# Patient Record
Sex: Male | Born: 1970 | Hispanic: Yes | Marital: Married | Smoking: Current every day smoker
Health system: Southern US, Community
[De-identification: ages and names within clinical notes are randomized; demographics above are authoritative.]

## PROBLEM LIST (undated history)

## (undated) HISTORY — PX: HERNIA REPAIR: SHX51

## (undated) HISTORY — PX: NOSE SURGERY: SHX723

---

## 2008-08-22 HISTORY — PX: APPENDECTOMY: SHX54

## 2012-01-03 ENCOUNTER — Emergency Department (HOSPITAL_BASED_OUTPATIENT_CLINIC_OR_DEPARTMENT_OTHER)
Admission: EM | Admit: 2012-01-03 | Discharge: 2012-01-03 | Disposition: A | Discharge status: Home / Self Care | Payer: Self-pay | Attending: Emergency Medicine | Admitting: Emergency Medicine

## 2012-01-03 ENCOUNTER — Encounter (HOSPITAL_BASED_OUTPATIENT_CLINIC_OR_DEPARTMENT_OTHER): Payer: Self-pay | Admitting: *Deleted

## 2012-01-03 DIAGNOSIS — S81819A Laceration without foreign body, unspecified lower leg, initial encounter: Secondary | ICD-10-CM

## 2012-01-03 DIAGNOSIS — W269XXA Contact with unspecified sharp object(s), initial encounter: Secondary | ICD-10-CM | POA: Insufficient documentation

## 2012-01-03 DIAGNOSIS — S81009A Unspecified open wound, unspecified knee, initial encounter: Secondary | ICD-10-CM | POA: Insufficient documentation

## 2012-01-03 DIAGNOSIS — S91009A Unspecified open wound, unspecified ankle, initial encounter: Secondary | ICD-10-CM | POA: Insufficient documentation

## 2012-01-03 MED ORDER — LIDOCAINE HCL 2 % IJ SOLN
20.0000 mL | Freq: Once | INTRAMUSCULAR | Status: AC
  Administered 2012-01-03: 20 mg via INTRADERMAL
  Filled 2012-01-03: qty 1

## 2012-01-03 NOTE — ED Notes (Signed)
Pt has laceration to left calf after being cut by piece of metal, tetanus UTD

## 2012-01-03 NOTE — ED Provider Notes (Signed)
Medical screening examination/treatment/procedure(s) were performed by non-physician practitioner and as supervising physician I was immediately available for consultation/collaboration.   Jomes Giraldo, MD 01/03/12 2209 

## 2012-01-03 NOTE — Discharge Instructions (Signed)
Cuidados de una laceracin - Adultos  (Laceration Care, Adult)  Una laceracin es un corte que atraviesa todas las capas de la piel. El corte llega hasta las capas inferiores de la piel.  CUIDADOS EN EL HOGAR  Si tiene puntos (suturas) o grapas:   Mantenga el corte limpio y seco.   Si tiene un (vendaje) cmbielo al menos una vez al da. Cmbielo si se moja o se ensucia, o segn las indicaciones del mdico.   Lave el corte dos veces por da con agua y jabn. Enjugelo con agua. Seque dando palmaditas con un pao limpio y seco.   Aplique una capa delgada de crema con medicamento sobre el corte, segn las indicaciones del mdico.   Puede ducharse despus de las primeras 24 horas. No remoje la herida en agua hasta que le hayan quitado los puntos.   Tome slo los medicamentos que le haya indicado el mdico.   Concurra para que le retiren los puntos cuando el mdico le indique.  En caso que tenga tiras adhesivas:   Mantenga la herida limpia y seca.   No deje que las tiras se mojen. Puede tomar un bao, pero tenga cuidado de no mojar el corte.   Si se moja, squelo dando palmaditas con una toalla limpia.   Las tiras caern por s mismas. No quite las tiras que estn pegadas a la herida.  En caso que le hayan aplicado adhesivo.   Puede ducharse o tomar un bao de inmersin. No frote ni sumerja la herida. Nopractique natacin. Evite transpirar mucho hasta que el adhesivo desaparezca. Despus de ducharse o darse un bao, seque el corte dando palmaditas con una toalla limpia.   No aplique medicamentos ni maquillaje hasta que el adhesivo caiga.   Si tiene un vendaje, No peque cinta adhesiva sobre el adhesivo   Evite la luz solar o las lmparas para bronceado hasta que el adhesivo desaparezca. Aplique pantalla solar sobre el corte durante el primer ao, para reducir la cicatriz.   El adhesivo caer por s mismo. No quite el pegamento.  Deber aplicarse la vacuna contra el ttanos si:  No  recuerda cundo se coloc la vacuna la ltima vez.   Nunca recibi esta vacuna.  Si usted necesita aplicarse la vacuna y se niega a recibirla, corre riesgo de contraer ttanos. sta es una enfermedad grave.  SOLICITE AYUDA DE INMEDIATO SI:   El dolor no mejora con los medicamentos prescriptos.   Pierde la sensibilidad (adormecimiento) en el brazo, la mano, la pierna o el pie u observa cambios en el color.   El corte sangra.   Siente debilidad en la articulacin o no puede usarla.   Tiene bultos que le duelen en el cuerpo.   La zona del corte est roja, le duele o esthinchada.   Hay una lnea roja en la piel, cerca del corte.   Observa un lquido blanco amarillento (pus) en la herida.   Tiene fiebre.   Advierte un olor ftido que proviene de la herida o del vendaje.   La herida se abre antes o despus de que le hayan quitado los puntos.   Nota que en la herida hay algn cuerpo extrao como un trozo de madera o vidrio.   No puede mover los dedos.  ASEGRESE DE QUE:   Comprende estas instrucciones.   Controlar su enfermedad.   Solicitar ayuda de inmediato si no mejora o si empeora.  Document Released: 04/06/2011 Document Revised: 07/28/2011 ExitCare Patient Information 2012 [employer]. 

## 2012-01-03 NOTE — ED Provider Notes (Signed)
History     CSN: [id number]  Arrival date & time 01/03/12  2013   First MD Initiated Contact with Patient 01/03/12 2015      Chief Complaint  Patient presents with  . Extremity Laceration    (Consider location/radiation/quality/duration/timing/severity/associated sxs/prior treatment) Patient is a 41 y.o. male presenting with skin laceration. The history is provided by the patient. No language interpreter was used.  Laceration  The incident occurred less than 1 hour ago. Pain location: left lower leg. The laceration is 7 cm in size. The laceration mechanism was a a metal edge. The patient is experiencing no pain. The pain has been constant since onset. He reports no foreign bodies present.    History reviewed. No pertinent past medical history.  History reviewed. No pertinent past surgical history.  History reviewed. No pertinent family history.  History  Substance Use Topics  . Smoking status: Former Games developer  . Smokeless tobacco: Not on file  . Alcohol Use: No      Review of Systems  Constitutional: Negative.   Respiratory: Negative.   Cardiovascular: Negative.   Skin: Positive for wound.    Allergies  Review of patient's allergies indicates no known allergies.  Home Medications  No current outpatient prescriptions on file.  BP 150/108  Pulse 65  Temp(Src) 98.5 F (36.9 C) (Oral)  Resp 18  SpO2 100%  Physical Exam  Nursing note and vitals reviewed. Constitutional: He is oriented to person, place, and time. He appears well-developed and well-nourished.  HENT:  Head: Normocephalic and atraumatic.  Eyes: EOM are normal.  Cardiovascular: Normal rate and regular rhythm.   Pulmonary/Chest: Effort normal and breath sounds normal.  Musculoskeletal: Normal range of motion.  Neurological: He is alert and oriented to person, place, and time.  Skin:       Pt has a laceration to the left lateral lower leg    ED Course  LACERATION REPAIR Performed by:  Teressa Lower Authorized by: Teressa Lower Consent: Verbal consent obtained. Written consent not obtained. Risks and benefits: risks, benefits and alternatives were discussed Consent given by: patient Patient understanding: patient states understanding of the procedure being performed Patient identity confirmed: verbally with patient Time out: Immediately prior to procedure a "time out" was called to verify the correct patient, procedure, equipment, support staff and site/side marked as required. Body area: lower extremity Location details: left lower leg Laceration length: 7 cm Foreign bodies: no foreign bodies Anesthesia: local infiltration Local anesthetic: lidocaine 2% without epinephrine Anesthetic total: 5 ml Irrigation solution: saline Irrigation method: syringe Amount of cleaning: standard Skin closure: staples Approximation: close Approximation difficulty: simple Patient tolerance: Patient tolerated the procedure well with no immediate complications.   (including critical care time)  Labs Reviewed - No data to display No results found.   1. Leg laceration       MDM  Pt tetanus is NWG:NFAOZ closed without any problem:no concern for fb based on injury        Teressa Lower, NP 01/03/12 2104

## 2013-05-24 ENCOUNTER — Emergency Department
Admission: EM | Admit: 2013-05-24 | Discharge: 2013-05-24 | Disposition: A | Discharge status: Home / Self Care | Payer: BC Managed Care – PPO | Source: Home / Self Care | Attending: Family Medicine | Admitting: Family Medicine

## 2013-05-24 ENCOUNTER — Emergency Department (INDEPENDENT_AMBULATORY_CARE_PROVIDER_SITE_OTHER): Payer: BC Managed Care – PPO

## 2013-05-24 ENCOUNTER — Encounter: Payer: Self-pay | Admitting: Emergency Medicine

## 2013-05-24 DIAGNOSIS — J342 Deviated nasal septum: Secondary | ICD-10-CM

## 2013-05-24 DIAGNOSIS — J341 Cyst and mucocele of nose and nasal sinus: Secondary | ICD-10-CM

## 2013-05-24 DIAGNOSIS — J3489 Other specified disorders of nose and nasal sinuses: Secondary | ICD-10-CM

## 2013-05-24 DIAGNOSIS — R030 Elevated blood-pressure reading, without diagnosis of hypertension: Secondary | ICD-10-CM

## 2013-05-24 MED ORDER — PREDNISONE 20 MG PO TABS: 20.0000 mg | ORAL_TABLET | Freq: Two times a day (BID) | ORAL | Status: DC

## 2013-05-24 NOTE — ED Notes (Signed)
Sinus problem, congestion, watery eyes, snoring, took 10 days of amoxicillin

## 2013-05-24 NOTE — ED Provider Notes (Signed)
CSN: [id number]     Arrival date & time 05/24/13  9562 History   First MD Initiated Contact with Patient 05/24/13 [phone]     Chief Complaint  Patient presents with  . Sinus Problem      HPI Comments: Patient complains of approximately 3 week history of increased sinus congestion resulting in mouth breathing at night.  He now snores at night, and reports increased daytime fatigue.  His was prescribed amoxicillin for sinusitis three weeks ago, as well as a steroid nasal spray.  He has had no improvement.  He tried Careers adviser without much improvement also.  He feels well otherwise.  No fevers, chills, and sweats.  No recent URI symptoms. No family history of hypertension.  The history is provided by the patient.    History reviewed. No pertinent past medical history. History reviewed. No pertinent past surgical history. No pertinent family history. History  Substance Use Topics  . Smoking status: Former Games developer  . Smokeless tobacco: Not on file  . Alcohol Use: No    Review of Systems  Constitutional: Positive for fatigue. Negative for fever and chills.  HENT: Positive for congestion, rhinorrhea and postnasal drip. Negative for ear pain, nosebleeds, sore throat, facial swelling, trouble swallowing, neck pain, neck stiffness, voice change and sinus pressure.   Eyes: Positive for discharge. Negative for photophobia, pain, redness and itching.  Respiratory:       Snoring  Cardiovascular: Negative.   Gastrointestinal: Negative.   Genitourinary: Negative.   Musculoskeletal: Negative.   Skin: Negative.   Neurological: Positive for headaches.  All other systems reviewed and are negative.    Allergies  Review of patient's allergies indicates not on file.  Home Medications   Current Outpatient Rx  Name  Route  Sig  Dispense  Refill  . ibuprofen (ADVIL,MOTRIN) 200 MG tablet   Oral   Take 400 mg by mouth every 6 (six) hours as needed. For pain         . predniSONE (DELTASONE) 20 MG  tablet   Oral   Take 1 tablet (20 mg total) by mouth 2 (two) times daily.   10 tablet   0    BP 153/101  Pulse 55  Temp(Src) 97.7 F (36.5 C) (Oral)  Ht 6' (1.829 m)  Wt 234 lb (106.142 kg)  BMI 31.73 kg/m2  SpO2 99% Physical Exam Nursing notes and Vital Signs reviewed. Appearance:  Patient appears stated age, and in no acute distress.  Patient is obese (BMI 31.7) Eyes:  Pupils are equal, round, and reactive to light and accomodation.  Extraocular movement is intact.  Conjunctivae are not inflamed  Ears:  Canals normal.  Tympanic membranes normal.  Nose:   Bilaterally congested and swollen turbinates.  No sinus tenderness.    Pharynx:  Rather restrictive oropharynx:  Unable to visualize posterior pharynx and tonsillar pillars.  Uvula barely visible. Neck:  Supple.   No adenopathy Lungs:  Clear to auscultation.  Breath sounds are equal.  Heart:  Regular rate and rhythm without murmurs, rubs, or gallops.  Skin:  No rash present.   ED Course  Procedures  none    Imaging Review Dg Sinuses Complete  05/24/2013   CLINICAL DATA:  Sinus congestion  EXAM: PARANASAL SINUSES - COMPLETE 3 + VIEW  COMPARISON:  None.  FINDINGS: Frontal, water's, and lateral views were obtained. There are suspected retention cysts in both inferior maxillary antra. Paranasal sinuses otherwise appear clear. There is no air-fluid level. No bony destruction  or expansion. There is leftward deviation of the inferior nasal septum.  IMPRESSION: Probable retention cysts in both inferior maxillary antra. Paranasal sinuses elsewhere clear. Mild deviation of nasal septum.   Electronically Signed   By: Bretta Bang M.D.   On: 05/24/2013 10:34    MDM   1. Mucous retention cyst of maxillary sinus, ?allergic rhinitis   2. Blood pressure elevated without history of HTN; consider sleep apnea    Begin prednisone burst. Continue prescription nose spray.  Begin Allegra, Claritin, or Zyrtec daily. Followup with ENT if not  improving. Followup with PCP for BP, and possible evaluation for sleep apnea     Lattie Haw, MD 05/24/13 [phone]

## 2013-08-07 ENCOUNTER — Ambulatory Visit (HOSPITAL_BASED_OUTPATIENT_CLINIC_OR_DEPARTMENT_OTHER): Payer: BC Managed Care – PPO

## 2014-06-05 ENCOUNTER — Emergency Department
Admission: EM | Admit: 2014-06-05 | Discharge: 2014-06-05 | Disposition: A | Discharge status: Home / Self Care | Payer: BC Managed Care – PPO | Source: Home / Self Care | Attending: Emergency Medicine | Admitting: Emergency Medicine

## 2014-06-05 ENCOUNTER — Encounter: Payer: Self-pay | Admitting: Emergency Medicine

## 2014-06-05 DIAGNOSIS — R1032 Left lower quadrant pain: Secondary | ICD-10-CM

## 2014-06-05 LAB — POCT URINALYSIS DIP (MANUAL ENTRY)
Bilirubin, UA: NEGATIVE
Glucose, UA: NEGATIVE
Ketones, POC UA: NEGATIVE
Leukocytes, UA: NEGATIVE
Nitrite, UA: NEGATIVE
Protein Ur, POC: NEGATIVE
Spec Grav, UA: 1.02 (ref 1.005–1.03)
Urobilinogen, UA: 0.2 (ref 0–1)
pH, UA: 5.5 (ref 5–8)

## 2014-06-05 LAB — POCT CBC W AUTO DIFF (K'VILLE URGENT CARE)

## 2014-06-05 NOTE — ED Provider Notes (Addendum)
CSN: [id number]     Arrival date & time 06/05/14  0831 History   First MD Initiated Contact with Patient 06/05/14 [phone]     Chief Complaint  Patient presents with  . Abdominal Pain   Reports four weeks of intermittent lower left abdominal pain, usually notices at night and feels better after going to bathroom.  HPI 43 year old male, no local PCP. Complains of 4 weeks of intermittent left lower quadrant abdominal pain. It can be dull or sharp without radiation. Sometimes severe up to 8/10, relieved after having a bowel movement. It was severe this morning, but relieved after normal BM this morning. Currently, abdominal pain level is 0. When he gets the pain, it's not associated with any other activity or movement. Denies change in bowel habits. No melena or bright red blood per rectum. No upper abdominal pain or symptoms or reflux symptoms. The abdominal pain is not affected by food. He eats a lot of spicy foods and that does not cause the left lower quadrant pain. Denies fever or chills or nausea or vomiting. I questioned him about any GU symptoms. Denies urinary frequency, dysuria, urethral discharge, or any chance of STD.  Urinary stream is normal . Denies hematuria. Denies any history of GU diagnoses . He admits to rare sharp penile pain about 2 times a day, lasts one second, and then resolves. Not associated with any activity or other symptoms.  Past surgical history of bilateral inguinal hernia repair January 2014 by Dr[facility]. He denies any inguinal or testicular pain. He denies flank pain or low back pain. History reviewed. No pertinent past medical history. Past Surgical History  Procedure Laterality Date  . Nose surgery    . Hernia repair Bilateral     inguinal   History reviewed. No pertinent family history. History  Substance Use Topics  . Smoking status: Former Games developermoker  . Smokeless tobacco: Not on file  . Alcohol Use: No    Review of Systems  All other  systems reviewed and are negative.   Allergies  Review of patient's allergies indicates no known allergies.  Home Medications   Prior to Admission medications   Not on File   BP 129/83  Pulse 56  Temp(Src) 97.7 F (36.5 C) (Oral)  Resp 16  SpO2 97% Physical Exam  Constitutional: He is oriented to person, place, and time. He appears well-developed and well-nourished. No distress.  Alert, pleasant male, no distress  HENT:  Head: Normocephalic and atraumatic.  Mouth/Throat: Oropharynx is clear and moist.  Eyes: Conjunctivae are normal. Pupils are equal, round, and reactive to light. No scleral icterus.  Neck: Normal range of motion. Neck supple. No JVD present.  Cardiovascular: Normal rate, regular rhythm and normal heart sounds.   Pulmonary/Chest: Effort normal and breath sounds normal. No respiratory distress. He has no wheezes. He has no rales. He exhibits no tenderness.  Abdominal: Soft. Bowel sounds are normal. He exhibits no distension, no abdominal bruit and no mass. There is no hepatosplenomegaly. There is tenderness in the left lower quadrant. There is no rigidity, no rebound, no guarding, no CVA tenderness, no tenderness at McBurney's point and negative Murphy's sign. Hernia confirmed negative in the right inguinal area and confirmed negative in the left inguinal area.    As depicted, equivocal LLQ abdominal wall hernia, reducible. Skin overlying this area is normal. No discoloration or ecchymosis.  Genitourinary: Testes normal and penis normal.  Musculoskeletal: Normal range of motion. He exhibits no edema  and no tenderness.  Lymphadenopathy:    He has no cervical adenopathy.  Neurological: He is alert and oriented to person, place, and time. No cranial nerve deficit.  Skin: Skin is warm and dry. No rash noted.  Psychiatric: He has a normal mood and affect. His behavior is normal.    ED Course  Procedures (including critical care time) Labs Review Labs Reviewed    URINE CULTURE  POCT URINALYSIS DIP (MANUAL ENTRY)  POCT CBC W AUTO DIFF ([facility])    Imaging Review No results found.   MDM   1. Abdominal pain, left lower quadrant    Urinalysis within normal limits except trace blood.--Likely not significant, but uncertain of significance. CBC: Within normal limits. Hemoglobin 6.8, hemoglobin 15.6  Currently, he's not having any left lower quadrant pain, except mild tenderness on physical exam, consistent with reducible abdominal wall hernia. Also in the differential is diverticulitis, although that's less likely as he has no fever and CBC is normal and pain is only intermittent. Many other GI possibilities in the differential, but there is no evidence of acute abdomen or surgical abdomen on physical exam. There are no GU symptoms, and significance of trace blood in the urine is uncertain. No evidence of UTI, but will send off urine culture.  Options discussed at length. No particular treatment at this time as he's not in pain now, but I advised followup with his surgeon within one week to reevaluate. He questioned about any type of imaging, and I advised that his surgeon her PCP may decide if he needs any imaging. Advised him to establish with a PCP.   See detailed Instructions in AVS, which were given to patient. Verbal instructions also given. Risks, benefits, and alternatives of treatment options discussed. Questions invited and answered. Patient voiced understanding and agreement with plans.   Lajean Manesavid Massey, MD 06/05/14 1104  Lajean Manesavid Massey, MD 06/05/14 [phone]

## 2014-06-05 NOTE — ED Notes (Addendum)
Reports four weeks of intermittent lower left abdominal pain, usually notices at night and feels better after going to bathroom.

## 2014-06-05 NOTE — Discharge Instructions (Signed)
Dolor abdominal (Abdominal Pain) El dolor de estmago (abdominal) puede tener muchas causas. La mayora de las veces, el dolor de [place]ago no es peligroso. Muchos de Franklin Resourcesestos casos de dolor de estmago pueden controlarse y tratarse en casa. CUIDADOS EN EL HOGAR   No tome medicamentos que lo ayuden a defecar (laxantes), salvo que su mdico se lo indique.  Solo tome los medicamentos que le haya indicado su mdico.  Coma o beba lo que le indique su mdico. Su mdico le dir si debe seguir una dieta especial. SOLICITE AYUDA SI:  No sabe cul es la causa del dolor de [place]ago.  Tiene dolor de estmago cuando siente ganas de vomitar (nuseas) o tiene colitis (diarrea).  Tiene dolor durante la miccin o la evacuacin.  El dolor de estmago lo despierta de noche.  Tiene dolor de Mir[place]estmago que empeora o [place]jora cuando come.  Tiene dolor de Mirantestmago que empeora cuando come CIGNAalimentos grasosos.  Tiene fiebre. SOLICITE AYUDA DE INMEDIATO SI:   El dolor no desaparece en un plazo mximo de 2horas.  No deja de (vomitar).  El dolor cambia y se Librarian, academiclocaliza solo en la parte derecha o izquierda del [place]ago.  La materia fecal es sanguinolenta o de aspecto alquitranado. ASEGRESE DE QUE:  Comprende estas instrucciones. Abdominal Pain-English Many things can cause belly (abdominal) pain. Most times, the belly pain is not dangerous. Many cases of belly pain can be watched and treated at home. HOME CARE  Do not take medicines that help you go poop (laxatives) unless told to by your doctor. Only take medicine as told by your doctor. Eat or drink as told by your doctor. Your doctor will tell you if you should be on a special diet. GET HELP IF: You do not know what is causing your belly pain. You have belly pain while you are sick to your stomach (nauseous) or have runny poop (diarrhea). You have pain while you pee or poop. Your belly pain wakes you up at night. You have belly pain that gets worse or  better when you eat. You have belly pain that gets worse when you eat fatty foods. You have a fever. GET HELP RIGHT AWAY IF:  The pain does not go away within 2 hours. You keep throwing up (vomiting). The pain changes and is only in the right or left part of the belly. You have bloody or tarry looking poop. MAKE SURE YOU:  Understand these instructions. Will watch your condition. Will get help right away if you are not doing well or get worse.  Most likely diagnosis is hernia of left lower abdominal wall.--Please followup with your surgeon within the next week to reevaluate. You might need other tests, but your surgeon should decide. If you have severe pain that doesn't go away , with vomiting, go directly to emergency room. No evidence of infection or blockage on physical exam. Blood tests today: Normal CBC. White blood cell 6.8, hemoglobin 15.6, which were all normal-- Urine test was normal except for trace blood, uncertain cause. Probably not significant--This can be rechecked at your primary care doctor.

## 2014-06-07 LAB — URINE CULTURE
Colony Count: NO GROWTH
Organism ID, Bacteria: NO GROWTH

## 2016-09-23 ENCOUNTER — Ambulatory Visit: Payer: Self-pay | Admitting: Medical

## 2018-09-17 ENCOUNTER — Emergency Department
Admission: EM | Admit: 2018-09-17 | Discharge: 2018-09-17 | Disposition: A | Discharge status: Home / Self Care | Payer: No Typology Code available for payment source | Source: Home / Self Care | Attending: Family Medicine | Admitting: Family Medicine

## 2018-09-17 ENCOUNTER — Other Ambulatory Visit: Payer: Self-pay

## 2018-09-17 DIAGNOSIS — H1132 Conjunctival hemorrhage, left eye: Secondary | ICD-10-CM

## 2018-09-17 DIAGNOSIS — L03012 Cellulitis of left finger: Secondary | ICD-10-CM | POA: Diagnosis not present

## 2018-09-17 MED ORDER — DOXYCYCLINE HYCLATE 100 MG PO CAPS: 100.0000 mg | ORAL_CAPSULE | Freq: Two times a day (BID) | ORAL | 0 refills | Status: DC

## 2018-09-17 NOTE — ED Triage Notes (Signed)
Pt had redness and swelling around the left thumb nail.  Left eye has redness that started this am.

## 2018-09-17 NOTE — Discharge Instructions (Addendum)
May apply refrigerated lubricating drops (such as "Refresh" tears, etc) to left eye as needed.  Soak left thumb in warm water for about 15 to 20 minutes, 2 to 3 times daily.

## 2018-09-17 NOTE — ED Provider Notes (Signed)
[facility]    CSN: [id number] Arrival date & time: 09/17/18  1427     History   Chief Complaint Chief Complaint  Patient presents with  . Finger Injury  . Eye Problem    HPI Samuel Gomez is a 48 y.o. male.   Patient presents with two problems: 1)  Two weeks ago he fell in sand, scraping his left thumb tip.  During the past several days he has developed swelling and tenderness adjacent to his left thumb fingernail. 2)  This morning he sneezed, and later someone commented that his left eye was red.  He denies pain, swelling, foreign body sensation, or changes in vision.  The history is provided by the patient.    History reviewed. No pertinent past medical history.  There are no active problems to display for this patient.   Past Surgical History:  Procedure Laterality Date  . HERNIA REPAIR Bilateral    inguinal  . NOSE SURGERY         Home Medications    Prior to Admission medications   Medication Sig Start Date End Date Taking? Authorizing Provider  doxycycline (VIBRAMYCIN) 100 MG capsule Take 1 capsule (100 mg total) by mouth 2 (two) times daily. Take with food. 09/17/18   Lattie Haw, MD    Family History History reviewed. No pertinent family history.  Social History Social History   Tobacco Use  . Smoking status: Former Smoker  Substance Use Topics  . Alcohol use: No  . Drug use: No     Allergies   Patient has no known allergies.   Review of Systems Review of Systems  Constitutional: Negative for chills, diaphoresis, fatigue and fever.  HENT: Negative for congestion, facial swelling, sinus pain and sore throat.   Eyes: Positive for redness. Negative for photophobia, pain, discharge, itching and visual disturbance.  Respiratory: Negative.   Cardiovascular: Negative.   Gastrointestinal: Negative.   Genitourinary: Negative.   Musculoskeletal:       Pain left thumb tip.  Skin: Positive for color change.     Physical  Exam Triage Vital Signs ED Triage Vitals  Enc Vitals Group     BP 09/17/18 1522 132/85     Pulse Rate 09/17/18 1522 62     Resp 09/17/18 1522 20     Temp 09/17/18 1522 98.1 F (36.7 C)     Temp Source 09/17/18 1522 Oral     SpO2 09/17/18 1522 99 %     Weight 09/17/18 1524 223 lb (101.2 kg)     Height 09/17/18 1524 6\' 2"  (1.88 m)     Head Circumference --      Peak Flow --      Pain Score 09/17/18 1523 2     Pain Loc --      Pain Edu? --      Excl. in GC? --    No data found.  Updated Vital Signs BP 132/85 (BP Location: Right Arm)   Pulse 62   Temp 98.1 F (36.7 C) (Oral)   Resp 20   Ht 6\' 2"  (1.88 m)   Wt 101.2 kg   SpO2 99%   BMI 28.63 kg/m   Visual Acuity Right Eye Distance:   Left Eye Distance:   Bilateral Distance:    Right Eye Near:   Left Eye Near:    Bilateral Near:     Physical Exam Vitals signs and nursing note reviewed.  Constitutional:      General:  He is not in acute distress.    Appearance: He is not ill-appearing.  HENT:     Head: Normocephalic.     Right Ear: External ear normal.     Left Ear: External ear normal.     Nose: Nose normal.     Mouth/Throat:     Pharynx: Oropharynx is clear.  Eyes:     General:        Right eye: No discharge.        Left eye: No discharge.     Extraocular Movements: Extraocular movements intact.     Conjunctiva/sclera:     Right eye: Right conjunctiva is not injected. No hemorrhage.    Left eye: Hemorrhage present.     Pupils: Pupils are equal, round, and reactive to light.      Comments: Left sub-conjunctival hemorrhage laterally as noted on diagram.  No eyelid swelling or tenderness.  Fundi benign.  Cardiovascular:     Rate and Rhythm: Normal rate.  Pulmonary:     Effort: Pulmonary effort is normal.  Musculoskeletal:     Comments: Radial edge of left thumb distal phalanx has erythema, tenderness to palpation, and swelling adjacent to fingernail.  Area is indurated.  Thumb IP joint has full range of  motion.  Lymphadenopathy:     Cervical: No cervical adenopathy.  Skin:    General: Skin is warm and dry.  Neurological:     Mental Status: He is alert.      UC Treatments / Results  Labs (all labs ordered are listed, but only abnormal results are displayed) Labs Reviewed - No data to display  EKG None  Radiology No results found.  Procedures Procedures (including critical care time)  Medications Ordered in UC Medications - No data to display  Initial Impression / Assessment and Plan / UC Course  I have reviewed the triage vital signs and the nursing notes.  Pertinent labs & imaging results that were available during my care of the patient were reviewed by me and considered in my medical decision making (see chart for details).    Patient reassured of the benign nature of his subconjunctival hemorrhage. Begin doxycycline for staph coverage of paronychia;  Followup with Family Doctor if not improved in one week.    Final Clinical Impressions(s) / UC Diagnoses   Final diagnoses:  Paronychia of left thumb  Subconjunctival hemorrhage of left eye     Discharge Instructions     May apply refrigerated lubricating drops (such as "Refresh" tears, etc) to left eye as needed.  Soak left thumb in warm water for about 15 to 20 minutes, 2 to 3 times daily.    ED Prescriptions    Medication Sig Dispense Auth. Provider   doxycycline (VIBRAMYCIN) 100 MG capsule Take 1 capsule (100 mg total) by mouth 2 (two) times daily. Take with food. 20 capsule Lattie Haw, MD        Lattie Haw, MD 09/19/18 2051

## 2020-04-09 ENCOUNTER — Emergency Department (INDEPENDENT_AMBULATORY_CARE_PROVIDER_SITE_OTHER): Payer: No Typology Code available for payment source

## 2020-04-09 ENCOUNTER — Emergency Department
Admission: EM | Admit: 2020-04-09 | Discharge: 2020-04-09 | Disposition: A | Discharge status: Home / Self Care | Payer: No Typology Code available for payment source | Source: Home / Self Care

## 2020-04-09 ENCOUNTER — Encounter: Payer: Self-pay | Admitting: Emergency Medicine

## 2020-04-09 ENCOUNTER — Other Ambulatory Visit: Payer: Self-pay

## 2020-04-09 DIAGNOSIS — J069 Acute upper respiratory infection, unspecified: Secondary | ICD-10-CM

## 2020-04-09 MED ORDER — BENZONATATE 100 MG PO CAPS: 100.0000 mg | ORAL_CAPSULE | Freq: Three times a day (TID) | ORAL | 0 refills | Status: DC

## 2020-04-09 NOTE — ED Provider Notes (Signed)
[facility]    CSN: [id number] Arrival date & time: 04/09/20  1631      History   Chief Complaint Chief Complaint  Patient presents with  . Cough    HPI Samuel Gomez is a 49 y.o. male.   HPI  Samuel Gomez is a 49 y.o. male presenting to UC with c/o dry cough for about 2.5 weeks. He returned from [place] about 2 weeks ago but saw a doctor while down there who "gave him a shot"  Pt is unsure what type of shot he was given but he was feeling better for a little bit. He does report having a negative Covid-19 test prior to leaving [place] to get on the plane. Denies fever, chills, n/v/d. He has not received the Covid-19 vaccine but he would like to be tested to make sure he does not have Covid-19 before he schedules an appointment to get the vaccine.    History reviewed. No pertinent past medical history.  There are no problems to display for this patient.   Past Surgical History:  Procedure Laterality Date  . HERNIA REPAIR Bilateral    inguinal  . NOSE SURGERY         Home Medications    Prior to Admission medications   Medication Sig Start Date End Date Taking? Authorizing Provider  benzonatate (TESSALON) 100 MG capsule Take 1-2 capsules (100-200 mg total) by mouth every 8 (eight) hours. 04/09/20   Lurene Shadow, PA-C    Family History Family History  Problem Relation Age of Onset  . Healthy Mother   . Healthy Father     Social History Social History   Tobacco Use  . Smoking status: Former Games developer  . Smokeless tobacco: Never Used  Vaping Use  . Vaping Use: Never used  Substance Use Topics  . Alcohol use: No  . Drug use: No     Allergies   Patient has no known allergies.   Review of Systems Review of Systems  Constitutional: Negative for chills and fever.  HENT: Positive for congestion. Negative for ear pain, sore throat, trouble swallowing and voice change.   Respiratory: Positive for cough. Negative for shortness of breath.     Cardiovascular: Negative for chest pain and palpitations.  Gastrointestinal: Negative for abdominal pain, diarrhea, nausea and vomiting.  Musculoskeletal: Negative for arthralgias, back pain and myalgias.  Skin: Negative for rash.  All other systems reviewed and are negative.    Physical Exam Triage Vital Signs ED Triage Vitals [04/09/20 1649]  Enc Vitals Group     BP (!) 149/96     Pulse Rate 71     Resp      Temp 98.1 F (36.7 C)     Temp Source Oral     SpO2 96 %     Weight 228 lb (103.4 kg)     Height 6\' 2"  (1.88 m)     Head Circumference      Peak Flow      Pain Score 0     Pain Loc      Pain Edu?      Excl. in GC?    No data found.  Updated Vital Signs BP (!) 149/96 (BP Location: Right Arm)   Pulse 71   Temp 98.1 F (36.7 C) (Oral)   Ht 6\' 2"  (1.88 m)   Wt 228 lb (103.4 kg)   SpO2 96%   BMI 29.27 kg/m   Visual Acuity Right Eye Distance:  Left Eye Distance:   Bilateral Distance:    Right Eye Near:   Left Eye Near:    Bilateral Near:     Physical Exam Vitals and nursing note reviewed.  Constitutional:      General: He is not in acute distress.    Appearance: Normal appearance. He is well-developed. He is not ill-appearing, toxic-appearing or diaphoretic.  HENT:     Head: Normocephalic and atraumatic.     Right Ear: Tympanic membrane and ear canal normal.     Left Ear: Tympanic membrane and ear canal normal.     Nose: Nose normal.     Mouth/Throat:     Lips: Pink.     Mouth: Mucous membranes are moist.     Pharynx: Oropharynx is clear. Uvula midline.  Cardiovascular:     Rate and Rhythm: Normal rate and regular rhythm.  Pulmonary:     Effort: Pulmonary effort is normal. No respiratory distress.     Breath sounds: Normal breath sounds. No stridor. No wheezing, rhonchi or rales.  Musculoskeletal:        General: Normal range of motion.     Cervical back: Normal range of motion.  Skin:    General: Skin is warm and dry.  Neurological:      Mental Status: He is alert and oriented to person, place, and time.  Psychiatric:        Behavior: Behavior normal.      UC Treatments / Results  Labs (all labs ordered are listed, but only abnormal results are displayed) Labs Reviewed  NOVEL CORONAVIRUS, NAA   Narrative:    Performed at:  [address] [place], [place]  [id number] Lab Director: Jolene Schimke MD, Phone:  [phone]  SARS-COV-2, NAA 2 DAY TAT   Narrative:    Performed at:  [address] [place], [place]  [id number] Lab Director: Jolene Schimke MD, Phone:  [phone]    EKG   Radiology DG Chest 2 View  Result Date: 04/09/2020 CLINICAL DATA:  URI with cough and congestion.  Cough for 2 weeks. EXAM: CHEST - 2 VIEW COMPARISON:  No pertinent prior exams are available for comparison. FINDINGS: Heart size within normal limits. No appreciable airspace consolidation or pulmonary edema. No evidence of pleural effusion or pneumothorax. No acute bony abnormality identified. IMPRESSION: No evidence of active cardiopulmonary disease. Electronically Signed   By: Jackey Loge DO   On: 04/09/2020 17:20    Procedures Procedures (including critical care time)  Medications Ordered in UC Medications - No data to display  Initial Impression / Assessment and Plan / UC Course  I have reviewed the triage vital signs and the nursing notes.  Pertinent labs & imaging results that were available during my care of the patient were reviewed by me and considered in my medical decision making (see chart for details).     No evidence of bacterial infection on exam Encouraged symptomatic tx F/u with PCP  AVS given  Final Clinical Impressions(s) / UC Diagnoses   Final diagnoses:  URI with cough and congestion     Discharge Instructions      You may take 500mg  acetaminophen every 4-6 hours or in combination with ibuprofen 400-600mg  every 6-8 hours as needed for pain,  inflammation, and fever.  Be sure to well hydrated with clear liquids and get at least 8 hours of sleep at night, preferably more while sick.   Please follow up with family medicine  in 1 week if not improving. Call 911 or have someone drive you to the hospital if symptoms worsening- chest pain, trouble breathing.   Due to concern for possibly having Covid-19, it is advised that you self-isolate at home until test results come back, usually 2-3 days.  If positive, it is recommended you stay isolated for at least 10 days after symptom onset and 24 hours after last fever without taking medication (whichever is longer).  If you MUST go out, please wear a mask at all times, limit contact with others.   If your test is negative, you still have plenty of time to get the Covid vaccine. It is recommended you schedule an appointment to get your vaccine once you get over this current illness.  Please ask your primary care provider about any questions/concerns related to the vaccine.      ED Prescriptions    Medication Sig Dispense Auth. Provider   benzonatate (TESSALON) 100 MG capsule Take 1-2 capsules (100-200 mg total) by mouth every 8 (eight) hours. 21 capsule Lurene Shadow, [place]     PDMP not reviewed this encounter.   Lurene Shadow, [place] 04/11/20 [phone]

## 2020-04-09 NOTE — ED Triage Notes (Signed)
Cough x 2 weeks Unvaccinated

## 2020-04-09 NOTE — Discharge Instructions (Signed)
  You may take 500mg  acetaminophen every 4-6 hours or in combination with ibuprofen 400-600mg  every 6-8 hours as needed for pain, inflammation, and fever.  Be sure to well hydrated with clear liquids and get at least 8 hours of sleep at night, preferably more while sick.   Please follow up with family medicine in 1 week if not improving. Call 911 or have someone drive you to the hospital if symptoms worsening- chest pain, trouble breathing.   Due to concern for possibly having Covid-19, it is advised that you self-isolate at home until test results come back, usually 2-3 days.  If positive, it is recommended you stay isolated for at least 10 days after symptom onset and 24 hours after last fever without taking medication (whichever is longer).  If you MUST go out, please wear a mask at all times, limit contact with others.   If your test is negative, you still have plenty of time to get the Covid vaccine. It is recommended you schedule an appointment to get your vaccine once you get over this current illness.  Please ask your primary care provider about any questions/concerns related to the vaccine.

## 2020-04-11 LAB — SARS-COV-2, NAA 2 DAY TAT

## 2020-04-11 LAB — NOVEL CORONAVIRUS, NAA: SARS-CoV-2, NAA: NOT DETECTED

## 2021-10-23 IMAGING — DX DG CHEST 2V
2 series · 2 of 2 positions shown · non-contrast
Comparison: No pertinent prior exams are available for comparison.

CLINICAL DATA: URI with cough and congestion.  Cough for 2 weeks.

EXAM:
CHEST - 2 VIEW

[chest pa]
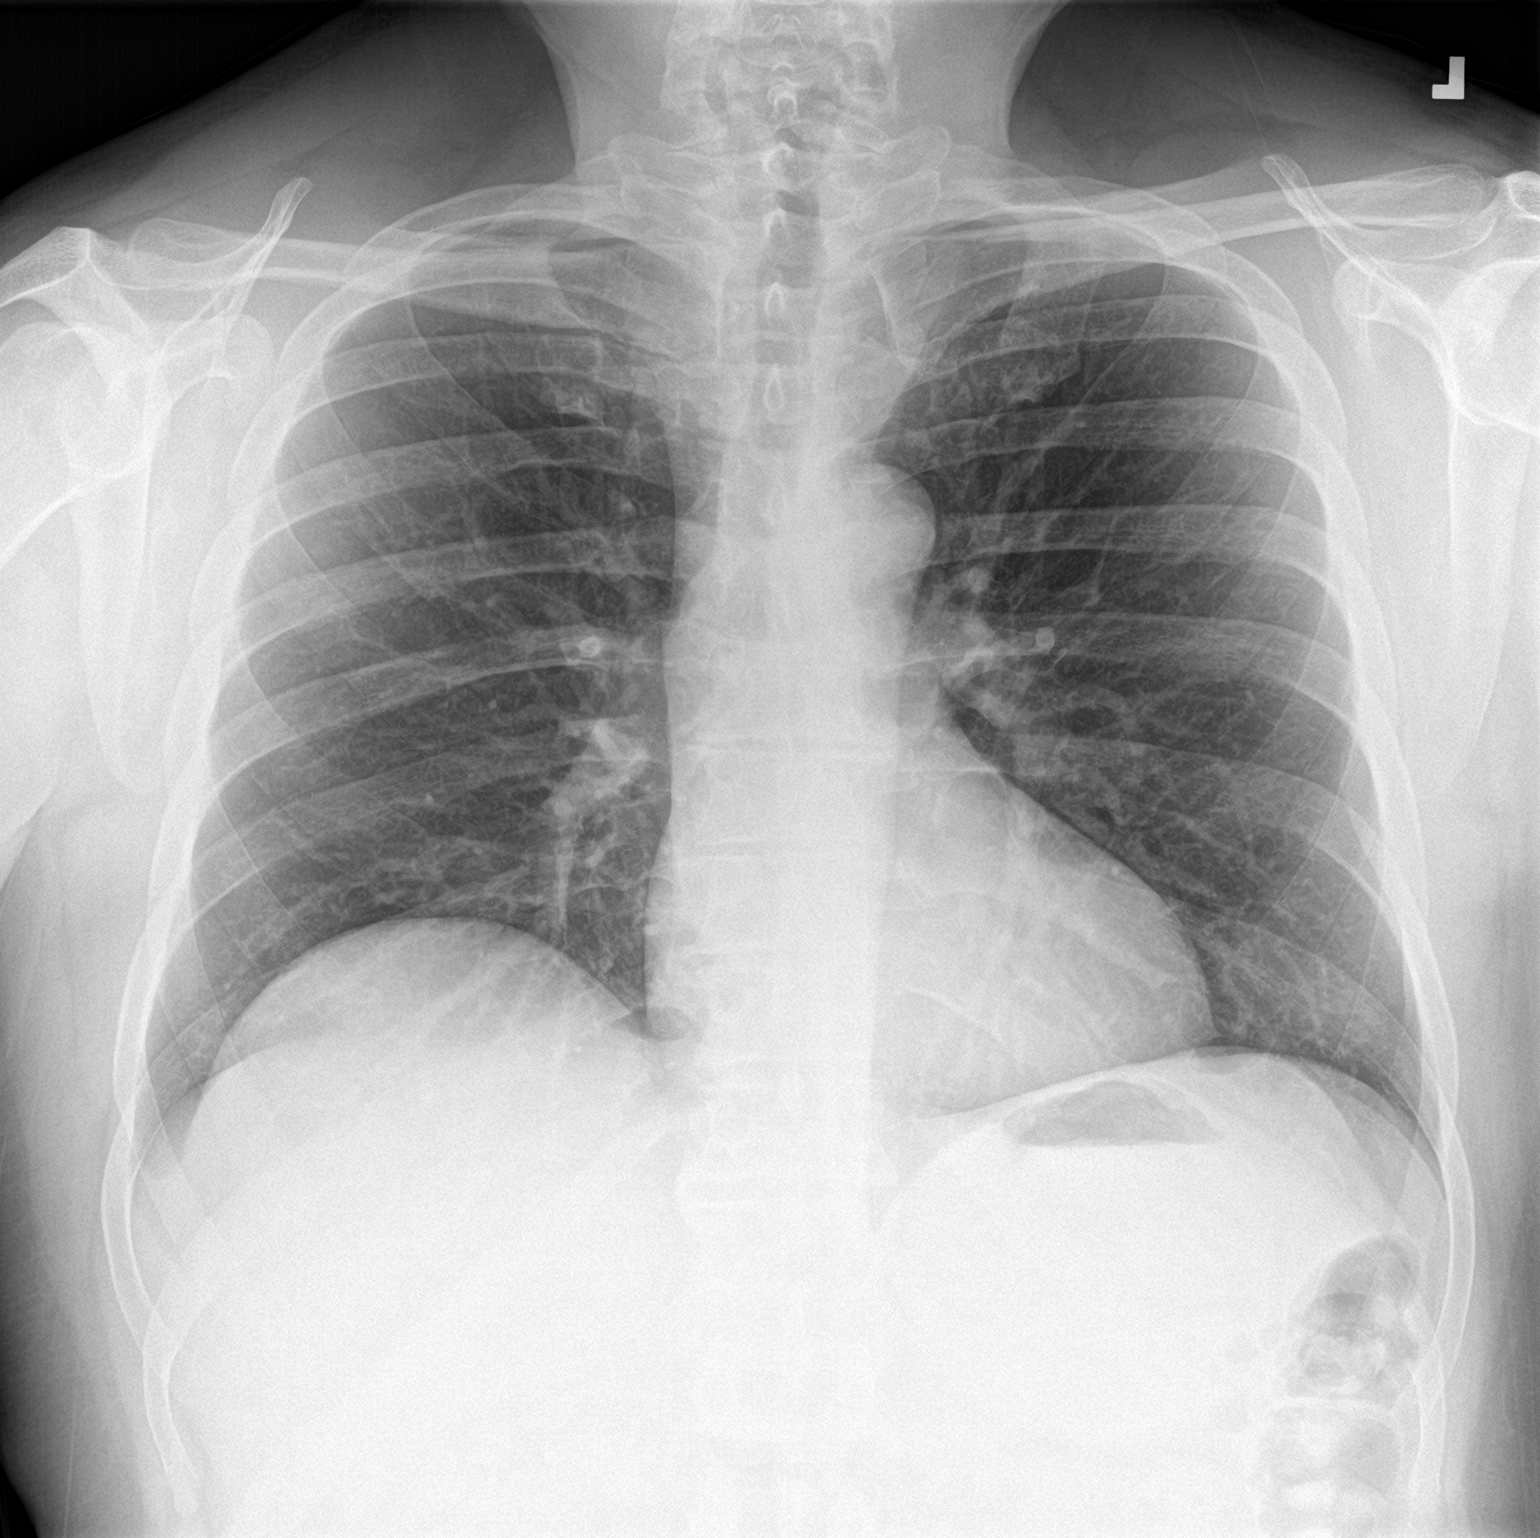

[chest lat]
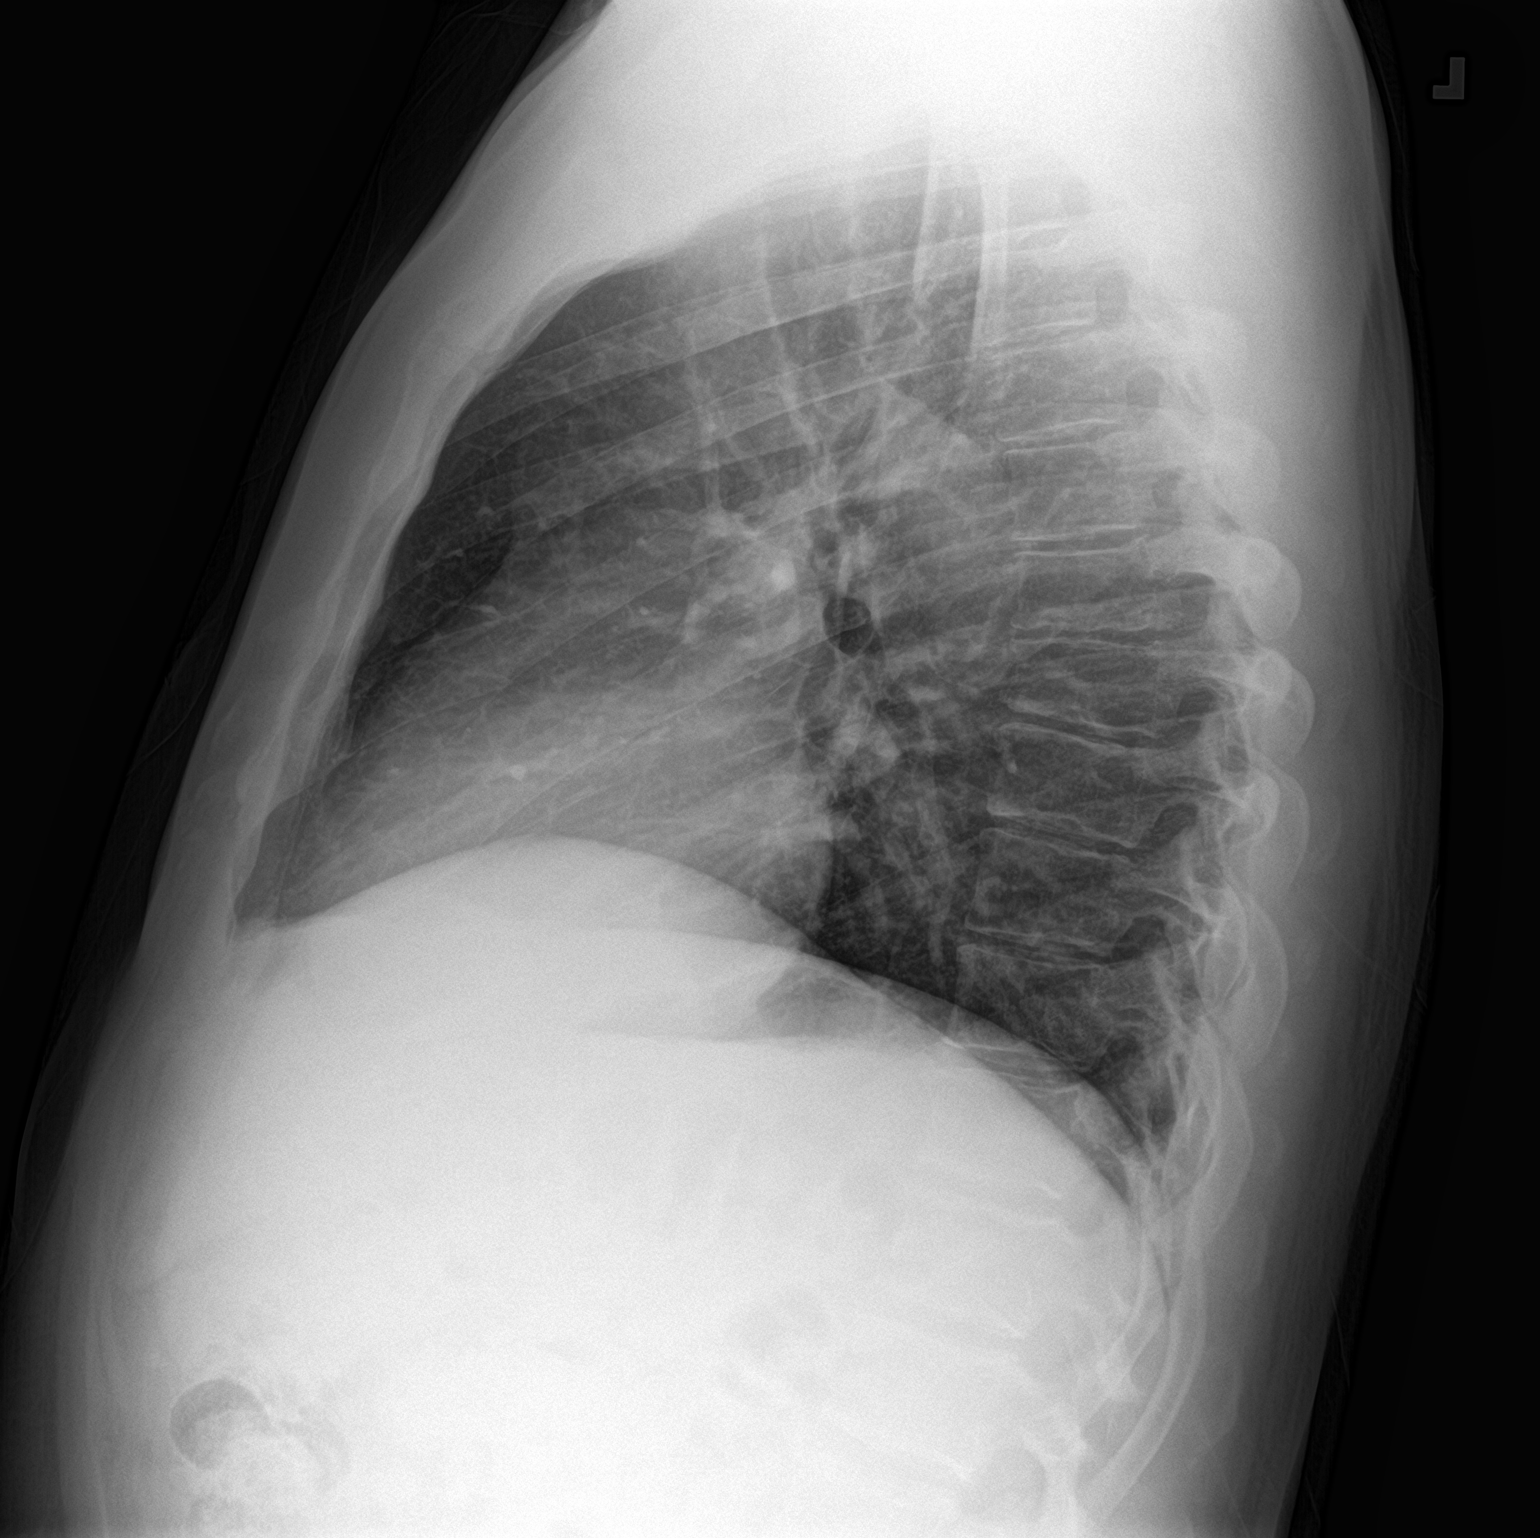

[2 of 2 positions shown; findings below may reference images not displayed]

FINDINGS: Heart size within normal limits. No appreciable airspace
consolidation or pulmonary edema. No evidence of pleural effusion or
pneumothorax. No acute bony abnormality identified.
IMPRESSION: No evidence of active cardiopulmonary disease.

## 2022-06-23 DIAGNOSIS — E785 Hyperlipidemia, unspecified: Secondary | ICD-10-CM | POA: Insufficient documentation

## 2022-06-23 DIAGNOSIS — R030 Elevated blood-pressure reading, without diagnosis of hypertension: Secondary | ICD-10-CM | POA: Insufficient documentation

## 2022-06-23 DIAGNOSIS — G4700 Insomnia, unspecified: Secondary | ICD-10-CM | POA: Insufficient documentation

## 2023-06-28 ENCOUNTER — Ambulatory Visit
Admission: EM | Admit: 2023-06-28 | Discharge: 2023-06-28 | Disposition: A | Discharge status: Home / Self Care | Payer: 59 | Attending: Family Medicine | Admitting: Family Medicine

## 2023-06-28 DIAGNOSIS — R059 Cough, unspecified: Secondary | ICD-10-CM

## 2023-06-28 DIAGNOSIS — J0100 Acute maxillary sinusitis, unspecified: Secondary | ICD-10-CM

## 2023-06-28 DIAGNOSIS — R030 Elevated blood-pressure reading, without diagnosis of hypertension: Secondary | ICD-10-CM

## 2023-06-28 DIAGNOSIS — J069 Acute upper respiratory infection, unspecified: Secondary | ICD-10-CM

## 2023-06-28 MED ORDER — BENZONATATE 200 MG PO CAPS: 200.0000 mg | ORAL_CAPSULE | Freq: Three times a day (TID) | ORAL | 0 refills | Status: AC | PRN

## 2023-06-28 MED ORDER — PROMETHAZINE-DM 6.25-15 MG/5ML PO SYRP: 5.0000 mL | ORAL_SOLUTION | Freq: Two times a day (BID) | ORAL | 0 refills | Status: DC | PRN

## 2023-06-28 MED ORDER — PREDNISONE 20 MG PO TABS: ORAL_TABLET | ORAL | 0 refills | Status: DC

## 2023-06-28 MED ORDER — AMOXICILLIN-POT CLAVULANATE 875-125 MG PO TABS: 1.0000 | ORAL_TABLET | Freq: Two times a day (BID) | ORAL | 0 refills | Status: AC

## 2023-06-28 NOTE — ED Provider Notes (Signed)
[facility]    CSN: [id number] Arrival date & time: 06/28/23  4098      History   Chief Complaint Chief Complaint  Patient presents with   Nasal Congestion    HPI Samuel Gomez is a 52 y.o. male.   HPI pleasant 52 year old male presents with cough and nasal congestion for 10 days with bilateral ear pressure.  PMH significant for obesity, s/p hernia repair/nose surgery.  PMH significant for obesity and elevated blood pressure.  History reviewed. No pertinent past medical history.  There are no problems to display for this patient.   Past Surgical History:  Procedure Laterality Date   HERNIA REPAIR Bilateral    inguinal   NOSE SURGERY         Home Medications    Prior to Admission medications   Medication Sig Start Date End Date Taking? Authorizing Provider  amoxicillin-clavulanate (AUGMENTIN) 875-125 MG tablet Take 1 tablet by mouth 2 (two) times daily for 10 days. 06/28/23 07/08/23 Yes Trevor Iha, FNP  benzonatate (TESSALON) 200 MG capsule Take 1 capsule (200 mg total) by mouth 3 (three) times daily as needed for up to 7 days. 06/28/23 07/05/23 Yes Trevor Iha, FNP  predniSONE (DELTASONE) 20 MG tablet Take 3 tabs PO daily x 5 days. 06/28/23  Yes Trevor Iha, FNP  promethazine-dextromethorphan (PROMETHAZINE-DM) 6.25-15 MG/5ML syrup Take 5 mLs by mouth 2 (two) times daily as needed for cough. 06/28/23  Yes Trevor Iha, FNP    Family History Family History  Problem Relation Age of Onset   Healthy Mother    Healthy Father     Social History Social History   Tobacco Use   Smoking status: Former   Smokeless tobacco: Never  Vaping Use   Vaping status: Never Used  Substance Use Topics   Alcohol use: No   Drug use: No     Allergies   Patient has no known allergies.   Review of Systems Review of Systems  HENT:  Positive for congestion.   All other systems reviewed and are negative.    Physical Exam Triage Vital Signs ED Triage  Vitals  Encounter Vitals Group     BP 06/28/23 0849 (!) 159/102     Systolic BP Percentile --      Diastolic BP Percentile --      Pulse Rate 06/28/23 0849 68     Resp 06/28/23 0849 17     Temp 06/28/23 0849 98.2 F (36.8 C)     Temp Source 06/28/23 0849 Oral     SpO2 06/28/23 0849 97 %     Weight --      Height --      Head Circumference --      Peak Flow --      Pain Score 06/28/23 0851 0     Pain Loc --      Pain Education --      Exclude from Growth Chart --    No data found.  Updated Vital Signs BP (!) 159/102 (BP Location: Right Arm)   Pulse 68   Temp 98.2 F (36.8 C) (Oral)   Resp 17   SpO2 97%    Physical Exam Vitals and nursing note reviewed.  Constitutional:      Appearance: Normal appearance. He is obese. He is ill-appearing.  HENT:     Head: Normocephalic and atraumatic.     Right Ear: Tympanic membrane and external ear normal.     Left Ear: Tympanic membrane and  external ear normal.     Ears:     Comments: Significant eustachian tube dysfunction noted bilaterally    Mouth/Throat:     Mouth: Mucous membranes are moist.     Pharynx: Oropharynx is clear.  Eyes:     Extraocular Movements: Extraocular movements intact.     Conjunctiva/sclera: Conjunctivae normal.     Pupils: Pupils are equal, round, and reactive to light.  Cardiovascular:     Rate and Rhythm: Normal rate and regular rhythm.     Pulses: Normal pulses.     Heart sounds: Normal heart sounds.     Comments: Hypertensive Pulmonary:     Effort: Pulmonary effort is normal.     Breath sounds: Normal breath sounds. No wheezing, rhonchi or rales.     Comments: Frequent nonproductive cough noted on exam Musculoskeletal:        General: Normal range of motion.     Cervical back: Normal range of motion and neck supple.  Skin:    General: Skin is warm and dry.  Neurological:     General: No focal deficit present.     Mental Status: He is alert and oriented to person, place, and time. Mental  status is at baseline.  Psychiatric:        Mood and Affect: Mood normal.        Behavior: Behavior normal.      UC Treatments / Results  Labs (all labs ordered are listed, but only abnormal results are displayed) Labs Reviewed - No data to display  EKG   Radiology No results found.  Procedures Procedures (including critical care time)  Medications Ordered in UC Medications - No data to display  Initial Impression / Assessment and Plan / UC Course  I have reviewed the triage vital signs and the nursing notes.  Pertinent labs & imaging results that were available during my care of the patient were reviewed by me and considered in my medical decision making (see chart for details).     MDM: 1.  Acute maxillary sinusitis, recurrence not specified-Rx'd Augmentin 875/125 mg tablet: Take 1 tablet twice daily x 10 days; 2.  Acute URI-Rx'd prednisone 1 mg tablet: Take 3 tabs p.o. daily x 5 days; 3.  Cough, unspecified type-Rx'd Tessalon 200 mg capsules: Take 1 capsule 3 times daily, as needed for cough, Promethazine DM 6.25-teen mg/5 mL syrup: Take 5 mL by mouth twice daily. Advised patient to take medications as directed with food to completion.  Advised patient to prednisone with first dose of Augmentin for the next 5 of 10 days.  Advised may use Tessalon capsules daily or as needed for cough.  Advised may use Promethazine DM at night prior to sleep for cough due to sedate of effects.  Encouraged to increase daily water intake to 64 ounces per day while taking these medications.  4.  Advised patient to monitor blood pressure daily if remains high please follow-up with PCP or here for further evaluation.  Advised if symptoms worsen and/or unresolved please follow-up with PCP ([facility] provider contact information provided with his AVS today) or here for further evaluation.  Patient discharged home, hemodynamically stable. Final Clinical Impressions(s) / UC Diagnoses    Final diagnoses:  Cough, unspecified type  Acute maxillary sinusitis, recurrence not specified  URI, acute  Elevated blood pressure reading in office without diagnosis of hypertension     Discharge Instructions      Advised patient to take medications as directed  with food to completion.  Advised patient to prednisone with first dose of Augmentin for the next 5 of 10 days.  Advised may use Tessalon capsules daily or as needed for cough.  Advised may use Promethazine DM at night prior to sleep for cough due to sedate of effects.  Encouraged to increase daily water intake to 64 ounces per day while taking these medications.  Advised patient to monitor blood pressure daily if remains high please follow-up with PCP or here for further evaluation.  Advised if symptoms worsen and/or unresolved please follow-up with PCP or here for further evaluation.     ED Prescriptions     Medication Sig Dispense Auth. Provider   amoxicillin-clavulanate (AUGMENTIN) 875-125 MG tablet Take 1 tablet by mouth 2 (two) times daily for 10 days. 20 tablet Trevor Iha, FNP   predniSONE (DELTASONE) 20 MG tablet Take 3 tabs PO daily x 5 days. 15 tablet Trevor Iha, FNP   benzonatate (TESSALON) 200 MG capsule Take 1 capsule (200 mg total) by mouth 3 (three) times daily as needed for up to 7 days. 40 capsule Trevor Iha, FNP   promethazine-dextromethorphan (PROMETHAZINE-DM) 6.25-15 MG/5ML syrup Take 5 mLs by mouth 2 (two) times daily as needed for cough. 118 mL Trevor Iha, FNP      PDMP not reviewed this encounter.   Trevor Iha, FNP 06/28/23 [phone]

## 2023-06-28 NOTE — ED Triage Notes (Signed)
Pt c/o cough and nasal congestion x 10 days. Some ear pressure. Taking nyquil and abx from [facility] x 3 days.

## 2023-06-28 NOTE — Discharge Instructions (Addendum)
Advised patient to take medications as directed with food to completion.  Advised patient to prednisone with first dose of Augmentin for the next 5 of 10 days.  Advised may use Tessalon capsules daily or as needed for cough.  Advised may use Promethazine DM at night prior to sleep for cough due to sedate of effects.  Encouraged to increase daily water intake to 64 ounces per day while taking these medications.  Advised patient to monitor blood pressure daily if remains high please follow-up with PCP or here for further evaluation.  Advised if symptoms worsen and/or unresolved please follow-up with PCP or here for further evaluation.

## 2023-06-29 ENCOUNTER — Ambulatory Visit: Payer: Self-pay | Admitting: Urgent Care

## 2023-06-29 ENCOUNTER — Encounter: Payer: Self-pay | Admitting: Urgent Care

## 2023-06-29 ENCOUNTER — Telehealth: Payer: Self-pay | Admitting: Urgent Care

## 2023-06-29 ENCOUNTER — Telehealth: Payer: Self-pay | Admitting: Family Medicine

## 2023-06-29 ENCOUNTER — Telehealth: Payer: Self-pay

## 2023-06-29 VITALS — BP 140/90 | HR 63 | Temp 98.0°F | Ht 73.0 in | Wt 236.4 lb

## 2023-06-29 DIAGNOSIS — E782 Mixed hyperlipidemia: Secondary | ICD-10-CM

## 2023-06-29 DIAGNOSIS — Z23 Encounter for immunization: Secondary | ICD-10-CM | POA: Diagnosis not present

## 2023-06-29 DIAGNOSIS — I10 Essential (primary) hypertension: Secondary | ICD-10-CM | POA: Diagnosis not present

## 2023-06-29 DIAGNOSIS — Z1329 Encounter for screening for other suspected endocrine disorder: Secondary | ICD-10-CM | POA: Diagnosis not present

## 2023-06-29 DIAGNOSIS — Z131 Encounter for screening for diabetes mellitus: Secondary | ICD-10-CM

## 2023-06-29 DIAGNOSIS — Z1159 Encounter for screening for other viral diseases: Secondary | ICD-10-CM

## 2023-06-29 DIAGNOSIS — Z1211 Encounter for screening for malignant neoplasm of colon: Secondary | ICD-10-CM | POA: Diagnosis not present

## 2023-06-29 DIAGNOSIS — K635 Polyp of colon: Secondary | ICD-10-CM | POA: Insufficient documentation

## 2023-06-29 DIAGNOSIS — Z125 Encounter for screening for malignant neoplasm of prostate: Secondary | ICD-10-CM

## 2023-06-29 DIAGNOSIS — G43909 Migraine, unspecified, not intractable, without status migrainosus: Secondary | ICD-10-CM | POA: Insufficient documentation

## 2023-06-29 DIAGNOSIS — Z6831 Body mass index (BMI) 31.0-31.9, adult: Secondary | ICD-10-CM

## 2023-06-29 DIAGNOSIS — Z114 Encounter for screening for human immunodeficiency virus [HIV]: Secondary | ICD-10-CM

## 2023-06-29 DIAGNOSIS — Z566 Other physical and mental strain related to work: Secondary | ICD-10-CM

## 2023-06-29 DIAGNOSIS — K219 Gastro-esophageal reflux disease without esophagitis: Secondary | ICD-10-CM | POA: Insufficient documentation

## 2023-06-29 DIAGNOSIS — R0683 Snoring: Secondary | ICD-10-CM | POA: Insufficient documentation

## 2023-06-29 LAB — CBC WITH DIFFERENTIAL/PLATELET
Basophils Absolute: 0.1 10*3/uL (ref 0.0–0.1)
Basophils Relative: 0.8 % (ref 0.0–3.0)
Eosinophils Absolute: 0.1 10*3/uL (ref 0.0–0.7)
Eosinophils Relative: 1 % (ref 0.0–5.0)
HCT: 47.8 % (ref 39.0–52.0)
Hemoglobin: 16.2 g/dL (ref 13.0–17.0)
Lymphocytes Relative: 24.5 % (ref 12.0–46.0)
Lymphs Abs: 2.1 10*3/uL (ref 0.7–4.0)
MCHC: 33.9 g/dL (ref 30.0–36.0)
MCV: 97 fL (ref 78.0–100.0)
Monocytes Absolute: 0.8 10*3/uL (ref 0.1–1.0)
Monocytes Relative: 9.5 % (ref 3.0–12.0)
Neutro Abs: 5.4 10*3/uL (ref 1.4–7.7)
Neutrophils Relative %: 64.2 % (ref 43.0–77.0)
Platelets: 291 10*3/uL (ref 150.0–400.0)
RBC: 4.93 Mil/uL (ref 4.22–5.81)
RDW: 13.5 % (ref 11.5–15.5)
WBC: 8.4 10*3/uL (ref 4.0–10.5)

## 2023-06-29 LAB — COMPREHENSIVE METABOLIC PANEL
ALT: 34 U/L (ref 0–53)
AST: 27 U/L (ref 0–37)
Albumin: 4.6 g/dL (ref 3.5–5.2)
Alkaline Phosphatase: 72 U/L (ref 39–117)
BUN: 13 mg/dL (ref 6–23)
CO2: 31 meq/L (ref 19–32)
Calcium: 9.6 mg/dL (ref 8.4–10.5)
Chloride: 102 meq/L (ref 96–112)
Creatinine, Ser: 0.84 mg/dL (ref 0.40–1.50)
GFR: 99.62 mL/min (ref >60.00)
Glucose, Bld: 96 mg/dL (ref 70–99)
Potassium: 4.8 meq/L (ref 3.5–5.1)
Sodium: 139 meq/L (ref 135–145)
Total Bilirubin: 1.3 mg/dL — ABNORMAL HIGH (ref 0.2–1.2)
Total Protein: 7.4 g/dL (ref 6.0–8.3)

## 2023-06-29 LAB — POCT URINALYSIS DIPSTICK
Bilirubin, UA: NEGATIVE
Blood, UA: NEGATIVE
Glucose, UA: NEGATIVE
Ketones, UA: NEGATIVE
Leukocytes, UA: NEGATIVE
Nitrite, UA: NEGATIVE
Protein, UA: NEGATIVE
Spec Grav, UA: 1.02 (ref 1.010–1.025)
Urobilinogen, UA: 0.2 U/dL
pH, UA: 6 (ref 5.0–8.0)

## 2023-06-29 LAB — PSA: PSA: 0.72 ng/mL (ref 0.10–4.00)

## 2023-06-29 LAB — LIPID PANEL
Cholesterol: 240 mg/dL — ABNORMAL HIGH (ref 0–200)
HDL: 39.3 mg/dL (ref >39.00)
LDL Cholesterol: 161 mg/dL — ABNORMAL HIGH (ref 0–99)
NonHDL: 200.3
Total CHOL/HDL Ratio: 6
Triglycerides: 196 mg/dL — ABNORMAL HIGH (ref 0.0–149.0)
VLDL: 39.2 mg/dL (ref 0.0–40.0)

## 2023-06-29 LAB — HEMOGLOBIN A1C: Hgb A1c MFr Bld: 5.9 % (ref 4.6–6.5)

## 2023-06-29 LAB — TSH: TSH: 2.23 u[IU]/mL (ref 0.35–5.50)

## 2023-06-29 MED ORDER — BENZONATATE 200 MG PO CAPS: 200.0000 mg | ORAL_CAPSULE | Freq: Three times a day (TID) | ORAL | 0 refills | Status: AC | PRN

## 2023-06-29 MED ORDER — AMOXICILLIN-POT CLAVULANATE 875-125 MG PO TABS: 1.0000 | ORAL_TABLET | Freq: Two times a day (BID) | ORAL | 0 refills | Status: AC

## 2023-06-29 MED ORDER — BUSPIRONE HCL 10 MG PO TABS: ORAL_TABLET | ORAL | 5 refills | Status: DC

## 2023-06-29 MED ORDER — PROMETHAZINE-DM 6.25-15 MG/5ML PO SYRP: 5.0000 mL | ORAL_SOLUTION | Freq: Two times a day (BID) | ORAL | 0 refills | Status: DC | PRN

## 2023-06-29 MED ORDER — LISINOPRIL 5 MG PO TABS: 5.0000 mg | ORAL_TABLET | Freq: Every day | ORAL | 0 refills | Status: DC

## 2023-06-29 MED ORDER — PREDNISONE 20 MG PO TABS: ORAL_TABLET | ORAL | 0 refills | Status: DC

## 2023-06-29 MED ORDER — ROSUVASTATIN CALCIUM 10 MG PO TABS: 20.0000 mg | ORAL_TABLET | Freq: Every evening | ORAL | 0 refills | Status: DC

## 2023-06-29 NOTE — Assessment & Plan Note (Signed)
Patient reports high levels of stress due to work and family issues. Patient does not currently have any stress management strategies in place. -Encourage regular exercise as a form of stress relief. -Consider referral to a psychologist or counselor for further stress management strategies. -start buspirone taper to goal of 10mg  BID

## 2023-06-29 NOTE — Telephone Encounter (Signed)
Lab results shows high total cholesterol and LDL, pts' current ASCVD risk score 19-22%. Will optimize risk factors, control bp and add crestor for now. 10mg  rosuvastatin Q HS.

## 2023-06-29 NOTE — Assessment & Plan Note (Signed)
Patient is overweight with a current weight of 236 lbs and a goal weight of 210 lbs. Patient acknowledges need for weight loss and has a gym membership but has not been active recently due to stress and family issues. -Encourage regular exercise and healthy diet. -Lifestyle modifications reviewed.

## 2023-06-29 NOTE — Assessment & Plan Note (Signed)
Patient has a history of high cholesterol but has not been on medication for several years due to side effects. Patient has been less active and has gained weight, which may have contributed to elevated cholesterol levels. -Check lipid panel. -Discuss potential need for medication depending on results.

## 2023-06-29 NOTE — Patient Instructions (Addendum)
Pleasure meeting you today.  We drew labs today and will review them upon receipt of the results.  Regarding your stress levels, I would recommend starting buspirone. This medication helps stress levels and also improves BP. Start taking 5mg  (1/2 tab) once daily in the morning x 5 days. Then increase to 1/2 twice daily x 5 days. Then take 10mg  (1 full tab) in the morning and 1/2 tab in the evening x 5 days. After 15 day taper, you may continue with taking one full tab (10mg ) twice daily.  Try to start going back to the gym. This releases natural hormones responsible for stress regulation. Mild weight loss will also give you more energy and improve your blood pressure and cholesterol levels.  We do need to get your blood pressure controlled - goal BP readings are 120/80. I would recommend we start with a very low dose BP medication to start safely reducing the blood pressure. Purchase a blood pressure cuff and monitor it at home as well. If you are able to lose weight and control stress, my goal would be to get you off this medication if possible. Start taking lisinopril 5mg  once daily in the morning.  Please return for a recheck in office in 3-4 weeks. We will review your labs at that time.

## 2023-06-29 NOTE — Assessment & Plan Note (Signed)
Possible Sleep Apnea Patient reports recent onset of snoring and restless sleep. Patient has a history of smoking and is currently overweight, both of which are risk factors for sleep apnea. -Refer to a sleep center for a sleep study.

## 2023-06-29 NOTE — Progress Notes (Signed)
New Patient Office Visit  Subjective:  Patient ID: Emin Foree, male    DOB: 1970-08-23  Age: 52 y.o. MRN: [id number]  CC:  Chief Complaint  Patient presents with   Establish Care    New pt est care. He has been dealing with high blood and high cholesterol. He is fasting today. Would like colonoscopy referral and flu shot    HPI Joselito Fieldhouse presents to establish care.   Pleasant 52yo male with a history of hypertension and high cholesterol, presents with concerns about elevated blood pressure. He reports previous episodes of high blood pressure, but has not been on medication for several years. He recalls being prescribed medication a few years ago, but only took it for a few days before deciding to manage his health through lifestyle changes, including exercise and a healthier diet. He reports a weight gain and acknowledges the need to lose about 20 pounds. He also reports a lack of exercise in the past year due to family stressors and work-related stress.  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 He also reports significant family stress, including the illness of his parents and the loss of a brother due to brain cancer. Had to travel back and forth to [place] numerous times in the past year for this. He has not been managing his stress effectively and has not been exercising regularly, despite having an active gym membership.  He also reports recent changes in his sleep patterns, including increased snoring and restless sleep. He has a history of smoking, having smoked for about 15 years starting from the age of 54, but quit several years ago. However, he admits to occasionally smoking a cigarette or two, particularly when under stress.  The patient has a family history of prostate cancer, with his father having been diagnosed at the age of 72. He reports no urinary symptoms such as hesitancy or frequency. He  had a colonoscopy two and a half years ago in [place], where a polyp was found and removed. He is unsure if the polyp was precancerous. He was told to have a repeat screening in three years, thus is requesting referral.  The patient also reports a history of high cholesterol, for which he was prescribed medication many years ago. He stopped taking the medication due to side effects, including headaches. He has not been taking any over-the-counter supplements or multivitamins recently.  Pt was seen at [facility] yesterday for a sinus infection, states he did not have time to pick up the scripts but will do so today.  General Health Maintenance -Administer flu vaccine today. -Refer for colonoscopy due to history of polyp found 2.5 years ago. -Screen for HIV and Hepatitis C. -Discuss shingles vaccine at a future visit. -Check prostate-specific antigen (PSA) due to family history of prostate cancer. -Collect urine sample to assess for urinary symptoms.  Outpatient Encounter Medications as of 06/29/2023  Medication Sig   busPIRone (BUSPAR) 10 MG tablet Take 1/2 tab once daily x 5 days, then 1/2 tab twice daily x 5 days, then 1 full tab in AM and 1/2 tab in PM x 5 days, then continue with one full tab PO BID   lisinopril (ZESTRIL) 5 MG tablet Take 1 tablet (5 mg total) by mouth daily. Take in the morning   amoxicillin-clavulanate (AUGMENTIN) 875-125 MG tablet Take 1 tablet by mouth 2 (two) times daily for 10 days. (Patient not taking:  Reported on 06/29/2023)   benzonatate (TESSALON) 200 MG capsule Take 1 capsule (200 mg total) by mouth 3 (three) times daily as needed for up to 7 days. (Patient not taking: Reported on 06/29/2023)   predniSONE (DELTASONE) 20 MG tablet Take 3 tabs PO daily x 5 days. (Patient not taking: Reported on 06/29/2023)   promethazine-dextromethorphan (PROMETHAZINE-DM) 6.25-15 MG/5ML syrup Take 5 mLs by mouth 2 (two) times daily as needed for cough. (Patient not taking: Reported on 06/29/2023)    [DISCONTINUED] cyclobenzaprine (FLEXERIL) 10 MG tablet TAKE 1 TABLET BY MOUTH EVERYDAY AT BEDTIME (Patient not taking: Reported on 06/29/2023)   [DISCONTINUED] diclofenac (VOLTAREN) 50 MG EC tablet TAKE 1 TABLET TWICE A DAY BY ORAL ROUTE AS NEEDED WITH FOOD (Patient not taking: Reported on 06/29/2023)   [DISCONTINUED] Melatonin 5 MG CHEW Take 1 tablet every day by oral route at bedtime. (Patient not taking: Reported on 06/29/2023)   No facility-administered encounter medications on file as of 06/29/2023.    History reviewed. No pertinent past medical history.  Past Surgical History:  Procedure Laterality Date   APPENDECTOMY  2010   HERNIA REPAIR Bilateral    inguinal   NOSE SURGERY      Family History  Problem Relation Age of Onset   Stroke Mother    Hypertension Mother    Rheum arthritis Mother    Hypertension Father    Cancer Father     Social History   Socioeconomic History   Marital status: Married    Spouse name: Not on file   Number of children: Not on file   Years of education: Not on file   Highest education level: Not on file  Occupational History   Not on file  Tobacco Use   Smoking status: Every Day    Types: Cigarettes   Smokeless tobacco: Never  Vaping Use   Vaping status: Never Used  Substance and Sexual Activity   Alcohol use: Yes   Drug use: No   Sexual activity: Yes    Partners: Female  Other Topics Concern   Not on file  Social History Narrative   Not on file   Social Determinants of Health   Financial Resource Strain: Not on file  Food Insecurity: Not on file  Transportation Needs: Not on file  Physical Activity: Not on file  Stress: Not on file  Social Connections: Not on file  Intimate Partner Violence: Not on file    ROS: as noted in HPI  Objective:  BP (!) 140/90   Pulse 63   Temp 98 F (36.7 C) (Oral)   Ht 6\' 1"  (1.854 m)   Wt 236 lb 6.4 oz (107.2 kg)   SpO2 97%   BMI 31.19 kg/m   Physical Exam Vitals and nursing note  reviewed.  Constitutional:      General: He is not in acute distress.    Appearance: Normal appearance. He is obese. He is not ill-appearing, toxic-appearing or diaphoretic.  HENT:     Head: Normocephalic and atraumatic.     Right Ear: Tympanic membrane, ear canal and external ear normal. There is no impacted cerumen.     Left Ear: Tympanic membrane, ear canal and external ear normal. There is no impacted cerumen.     Nose: Nose normal. No congestion or rhinorrhea.     Mouth/Throat:     Mouth: Mucous membranes are moist.     Pharynx: Oropharynx is clear. No oropharyngeal exudate or posterior oropharyngeal erythema.  Eyes:  General: No scleral icterus.       Right eye: No discharge.        Left eye: No discharge.     Extraocular Movements: Extraocular movements intact.     Pupils: Pupils are equal, round, and reactive to light.  Neck:     Thyroid: No thyroid mass, thyromegaly or thyroid tenderness.  Cardiovascular:     Rate and Rhythm: Normal rate and regular rhythm.     Pulses: Normal pulses.     Heart sounds: No murmur heard. Pulmonary:     Effort: Pulmonary effort is normal. No respiratory distress.     Breath sounds: Normal breath sounds. No stridor. No wheezing or rhonchi.  Abdominal:     General: Abdomen is flat. Bowel sounds are normal. There is no distension.     Palpations: Abdomen is soft. There is no mass.     Tenderness: There is no abdominal tenderness. There is no guarding.  Musculoskeletal:     Cervical back: Normal range of motion and neck supple. No rigidity or tenderness.     Right lower leg: No edema.     Left lower leg: No edema.  Lymphadenopathy:     Cervical: No cervical adenopathy.  Skin:    General: Skin is warm and dry.     Coloration: Skin is not jaundiced.     Findings: No bruising, erythema or rash.  Neurological:     General: No focal deficit present.     Mental Status: He is alert and oriented to person, place, and time.     Sensory: No  sensory deficit.     Motor: No weakness.  Psychiatric:        Mood and Affect: Mood normal.        Behavior: Behavior normal.      Lab Results  Component Value Date   COLORU yellow 06/29/2023   CLARITYU clear 06/29/2023   GLUCOSEUR Negative 06/29/2023   BILIRUBINUR negative 06/29/2023   KETONESU negative 06/29/2023   SPECGRAV 1.020 06/29/2023   RBCUR negative 06/29/2023   PHUR 6.0 06/29/2023   PROTEINUR Negative 06/29/2023   UROBILINOGEN 0.2 06/29/2023   LEUKOCYTESUR Negative 06/29/2023    No recent labs for review    Assessment & Plan:  Essential hypertension Assessment & Plan: New diagnosis. Patient has had previous elevated readings for the past 5 years, but has not been on medication for several years. Patient has gained weight and has been less active due to stress and family issues. -Encourage regular exercise and weight loss. -Check comprehensive metabolic panel, lipid panel, and thyroid function tests. -start lisinopril 5mg  daily -also start buspirone as stress may be an additive factor -DASH diet - lower sodium intake  Orders: -     CBC with Differential/Platelet -     Comprehensive metabolic panel -     TSH -     POCT urinalysis dipstick -     busPIRone HCl; Take 1/2 tab once daily x 5 days, then 1/2 tab twice daily x 5 days, then 1 full tab in AM and 1/2 tab in PM x 5 days, then continue with one full tab PO BID  Dispense: 60 tablet; Refill: 5 -     Lisinopril; Take 1 tablet (5 mg total) by mouth daily. Take in the morning  Dispense: 90 tablet; Refill: 0  Mixed hyperlipidemia Assessment & Plan: Patient has a history of high cholesterol but has not been on medication for several years due to side effects.  Patient has been less active and has gained weight, which may have contributed to elevated cholesterol levels. -Check lipid panel. -Discuss potential need for medication depending on results.  Orders: -     CBC with Differential/Platelet -      Comprehensive metabolic panel -     Lipid panel -     TSH  Colon cancer screening -     Ambulatory referral to Gastroenterology  Flu vaccine need -     Flu vaccine trivalent PF, 6mos and older(Flulaval,Afluria,Fluarix,Fluzone)  Thyroid disorder screen -     TSH  Prostate cancer screening -     PSA -     POCT urinalysis dipstick  Diabetes mellitus screening -     Hemoglobin A1c  Encounter for screening for HIV -     HIV Antibody (routine testing w rflx)  Need for hepatitis C screening test -     Hepatitis C antibody  Snoring Assessment & Plan: Possible Sleep Apnea Patient reports recent onset of snoring and restless sleep. Patient has a history of smoking and is currently overweight, both of which are risk factors for sleep apnea. -Refer to a sleep center for a sleep study.  Orders: -     Ambulatory referral to Sleep Studies  Work-related stress Assessment & Plan: Patient reports high levels of stress due to work and family issues. Patient does not currently have any stress management strategies in place. -Encourage regular exercise as a form of stress relief. -Consider referral to a psychologist or counselor for further stress management strategies. -start buspirone taper to goal of 10mg  BID  Orders: -     busPIRone HCl; Take 1/2 tab once daily x 5 days, then 1/2 tab twice daily x 5 days, then 1 full tab in AM and 1/2 tab in PM x 5 days, then continue with one full tab PO BID  Dispense: 60 tablet; Refill: 5  BMI 31.0-31.9,adult Assessment & Plan: Patient is overweight with a current weight of 236 lbs and a goal weight of 210 lbs. Patient acknowledges need for weight loss and has a gym membership but has not been active recently due to stress and family issues. -Encourage regular exercise and healthy diet. -Lifestyle modifications reviewed.   Total time spent with patient face to face, including workup, chart review, documentation and counseling was 45  minutes.  Return in about 3 weeks (around 07/20/2023).   Maretta Bees, PA

## 2023-06-29 NOTE — Telephone Encounter (Signed)
Patient unable to receive initial prescriptions from original pharmacy we have sent for prescriptions to requested pharmacy [facility] [address], [place].

## 2023-06-29 NOTE — Assessment & Plan Note (Signed)
New diagnosis. Patient has had previous elevated readings for the past 5 years, but has not been on medication for several years. Patient has gained weight and has been less active due to stress and family issues. -Encourage regular exercise and weight loss. -Check comprehensive metabolic panel, lipid panel, and thyroid function tests. -start lisinopril 5mg  daily -also start buspirone as stress may be an additive factor -DASH diet - lower sodium intake

## 2023-06-30 LAB — HEPATITIS C ANTIBODY: Hepatitis C Ab: NONREACTIVE

## 2023-06-30 LAB — HIV ANTIBODY (ROUTINE TESTING W REFLEX): HIV 1&2 Ab, 4th Generation: NONREACTIVE

## 2023-07-19 ENCOUNTER — Telehealth: Payer: Self-pay | Admitting: Urgent Care

## 2023-07-19 ENCOUNTER — Ambulatory Visit: Payer: 59 | Admitting: Urgent Care

## 2023-07-19 NOTE — Telephone Encounter (Signed)
Pt came in office stating is wanting to [facility] from PCP Guy Sandifer to our office LBSW with Esperanza Richters due to language - pt speaks spanish and would like to have someone to communicate in his language if ok with providers. Please advise. Pt tel [phone].

## 2023-07-19 NOTE — Telephone Encounter (Signed)
I do speak Spanish as well, he never indicated this was desired at our appointment. But if he would prefer to be seen at your office that is fine with me. He missed his appointment with [place] this morning. thanks

## 2023-07-24 NOTE — Telephone Encounter (Signed)
[id number] to reschedule missed appt from 11/27.  Sent patient mychart message also.

## 2023-07-24 NOTE — Telephone Encounter (Signed)
Spoke with pt and informed the below, pt stated was not aware the his provider spoke spanish and will continue with his provider (Whitney L. Verlon Setting) at [facility]. Pt was informed that he missed his appt at [facility], pt will call to schedule his next appt. Done

## 2023-07-27 ENCOUNTER — Encounter: Payer: Self-pay | Admitting: Urgent Care

## 2023-07-27 ENCOUNTER — Ambulatory Visit (INDEPENDENT_AMBULATORY_CARE_PROVIDER_SITE_OTHER): Payer: 59 | Admitting: Urgent Care

## 2023-07-27 VITALS — BP 130/80 | HR 61 | Temp 97.8°F | Wt 239.4 lb

## 2023-07-27 DIAGNOSIS — E782 Mixed hyperlipidemia: Secondary | ICD-10-CM

## 2023-07-27 DIAGNOSIS — R0683 Snoring: Secondary | ICD-10-CM

## 2023-07-27 DIAGNOSIS — Z566 Other physical and mental strain related to work: Secondary | ICD-10-CM

## 2023-07-27 DIAGNOSIS — I10 Essential (primary) hypertension: Secondary | ICD-10-CM | POA: Diagnosis not present

## 2023-07-27 DIAGNOSIS — R17 Unspecified jaundice: Secondary | ICD-10-CM | POA: Insufficient documentation

## 2023-07-27 DIAGNOSIS — Z23 Encounter for immunization: Secondary | ICD-10-CM

## 2023-07-27 MED ORDER — LISINOPRIL 5 MG PO TABS: 5.0000 mg | ORAL_TABLET | Freq: Every day | ORAL | 1 refills | Status: DC

## 2023-07-27 MED ORDER — BUSPIRONE HCL 10 MG PO TABS: 10.0000 mg | ORAL_TABLET | Freq: Two times a day (BID) | ORAL | 1 refills | Status: DC

## 2023-07-27 MED ORDER — ROSUVASTATIN CALCIUM 20 MG PO TABS: 20.0000 mg | ORAL_TABLET | Freq: Every evening | ORAL | 1 refills | Status: DC

## 2023-07-27 MED ORDER — BUSPIRONE HCL 10 MG PO TABS
10.0000 mg | ORAL_TABLET | Freq: Two times a day (BID) | ORAL | 1 refills | Status: AC
Start: 2023-07-27 — End: 2023-10-25

## 2023-07-27 NOTE — Assessment & Plan Note (Signed)
Pt has responded well to buspirone. Was able to quit smoking. Continue with 10mg  BID

## 2023-07-27 NOTE — Assessment & Plan Note (Signed)
Minimally - 1.3. Pt asx. Will monitor.

## 2023-07-27 NOTE — Progress Notes (Signed)
Established Patient Office Visit  Subjective:  Patient ID: Samuel Gomez, male    DOB: [birth year]  Age: 52 y.o. MRN: [id number]  Chief Complaint  Patient presents with   Hypertension    [facility];    Pleasant 52yo male presents today for a one month follow up. He is taking 5mg  lisinopril every morning with good response to his BP. He also started buspirone taper, and is now on the maintenance dose of 10mg  BID. He states he feels much better and was able to completely stop smoking. He was doing this for work related stress previously, but states the medication (buspar) allowed him to stop completely and is one month off all forms of nicotine. It also has helped with his sleep he reports. He also started taking crestor every evening. He is tolerating all medications well without any adverse reactions.  He has not yet had a call from the sleep center. He apparently was in contact just yesterday with his physician in [place] and states that he had a colonoscopy in 2022. He does not know the results of the colonoscopy however and is trying to get a hard copy of the results to determine when he is actually due. Does have a minimally elevated bilirubin level. Denies known elevations in the past, denies RUQ pain, jaundice or any other sx. Pt denies any new or additional complaints today.  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 No pertinent past medical history. Social History   Tobacco Use   Smoking  status: Former   Smokeless tobacco: Never  Advertising account planner   Vaping status: Never Used  Substance Use Topics   Alcohol use: Yes   Drug use: No      ROS: as noted in HPI  Objective:     BP 130/80   Pulse 61   Temp 97.8 F (36.6 C)   Wt 239 lb 6.4 oz (108.6 kg)   SpO2 96%   BMI 31.59 kg/m  BP Readings from Last 3 Encounters:  07/27/23 130/80  06/29/23 (!) 140/90  06/28/23 (!) 148/96   Wt Readings from Last 3 Encounters:  07/27/23 239 lb 6.4 oz (108.6 kg)  06/29/23 236 lb 6.4 oz (107.2 kg)  04/09/20 228 lb (103.4 kg)      Physical Exam Vitals and nursing note reviewed.  Constitutional:      General: He is not in acute distress.    Appearance: Normal appearance. He is not ill-appearing, toxic-appearing or diaphoretic.  HENT:     Head: Normocephalic and atraumatic.     Right Ear: External ear normal.     Left Ear: External ear normal.     Nose: Nose normal.  Eyes:     General: No scleral icterus.    Pupils: Pupils are equal, round, and reactive to light.  Cardiovascular:     Rate and Rhythm: Normal rate.  Pulmonary:     Effort: Pulmonary effort is normal. No respiratory distress.  Skin:    General: Skin is warm and dry.     Findings:  No erythema or rash.  Neurological:     General: No focal deficit present.     Mental Status: He is alert and oriented to person, place, and time.      No results found for any visits on 07/27/23.  Last CBC Lab Results  Component Value Date   WBC 8.4 06/29/2023   HGB 16.2 06/29/2023   HCT 47.8 06/29/2023   MCV 97.0 06/29/2023   RDW 13.5 06/29/2023   PLT 291.0 06/29/2023   Last metabolic panel Lab Results  Component Value Date   GLUCOSE 96 06/29/2023   NA 139 06/29/2023   K 4.8 06/29/2023   CL 102 06/29/2023   CO2 31 06/29/2023   BUN 13 06/29/2023   CREATININE 0.84 06/29/2023   GFR 99.62 06/29/2023   CALCIUM 9.6 06/29/2023   PROT 7.4 06/29/2023   ALBUMIN 4.6 06/29/2023   BILITOT 1.3 (H) 06/29/2023   ALKPHOS 72  06/29/2023   AST 27 06/29/2023   ALT 34 06/29/2023   Last lipids Lab Results  Component Value Date   CHOL 240 (H) 06/29/2023   HDL 39.30 06/29/2023   LDLCALC 161 (H) 06/29/2023   TRIG 196.0 (H) 06/29/2023   CHOLHDL 6 06/29/2023   Last hemoglobin A1c Lab Results  Component Value Date   HGBA1C 5.9 06/29/2023      The 10-year ASCVD risk score (Arnett DK, et al., 2019) is: 17%  Assessment & Plan:  Essential hypertension Assessment & Plan: Now well controlled and stable.  Orders: -     Lisinopril; Take 1 tablet (5 mg total) by mouth daily. Take in the morning  Dispense: 90 tablet; Refill: 1 -     busPIRone HCl; Take 1 tablet (10 mg total) by mouth 2 (two) times daily.  Dispense: 180 tablet; Refill: 1  Work-related stress Assessment & Plan: Pt has responded well to buspirone. Was able to quit smoking. Continue with 10mg  BID  Orders: -     busPIRone HCl; Take 1 tablet (10 mg total) by mouth 2 (two) times daily.  Dispense: 180 tablet; Refill: 1  Mixed hyperlipidemia Assessment & Plan: Due to high ASCVD score, pt was started on 20mg  crestor. Will continue and recheck lipid panel in 6 months. Pt tolerating medication well.   Orders: -     Rosuvastatin Calcium; Take 1 tablet (20 mg total) by mouth at bedtime.  Dispense: 90 tablet; Refill: 1  High total bilirubin Assessment & Plan: Minimally - 1.3. Pt asx. Will monitor.    Snoring Assessment & Plan: Address and phone number given to patient to call and schedule possible sleep study.      Return in about 6 months (around 01/25/2024).   Maretta Bees, PA

## 2023-07-27 NOTE — Patient Instructions (Addendum)
Toma lisinopril 5mg  cada manana.  Toma buspirone dos veces cada dia.  Yo cambie tu dosis de Crestor para 20mg  entonces no necessitas tomar [employer]. Solo una cada noche.   Puedes llamar el numero abajo para obtener Neomia Dear cita:   [facility] [address], [address] [place],  [place]  [address]     Main: [phone]   Fax: [phone]

## 2023-07-27 NOTE — Progress Notes (Signed)
[facility] [address] [place],  [place]  [address]   Main: [phone]  Fax: [phone]

## 2023-07-27 NOTE — Assessment & Plan Note (Signed)
Due to high ASCVD score, pt was started on 20mg  crestor. Will continue and recheck lipid panel in 6 months. Pt tolerating medication well.

## 2023-07-27 NOTE — Assessment & Plan Note (Signed)
Now well controlled and stable.

## 2023-07-27 NOTE — Assessment & Plan Note (Signed)
Address and phone number given to patient to call and schedule possible sleep study.

## 2023-08-01 ENCOUNTER — Encounter: Payer: Self-pay | Admitting: Urgent Care

## 2023-08-09 DIAGNOSIS — J342 Deviated nasal septum: Secondary | ICD-10-CM | POA: Insufficient documentation

## 2024-01-13 LAB — HEPATIC FUNCTION PANEL
ALT: 36 U/L (ref 10–40)
AST: 28 (ref 14–40)
Bilirubin, Direct: 0.6 — AB
Bilirubin, Total: 3.7

## 2024-01-13 LAB — COMPREHENSIVE METABOLIC PANEL WITH GFR
Albumin: 4.3 (ref 3.5–5.0)
Calcium: 9.9 (ref 8.7–10.7)
Globulin: 3.2

## 2024-01-13 LAB — CBC AND DIFFERENTIAL
HCT: 49 (ref 41–53)
Hemoglobin: 15.4 (ref 13.5–17.5)

## 2024-01-13 LAB — BASIC METABOLIC PANEL WITH GFR
BUN: 17 (ref 4–21)
Chloride: 103 (ref 99–108)
Creatinine: 0.9 (ref 0.6–1.3)
Glucose: 114
Potassium: 5.2 meq/L — AB (ref 3.5–5.1)
Sodium: 143 (ref 137–147)

## 2024-01-16 LAB — LIPID PANEL
Cholesterol: 124 (ref 0–200)
HDL: 33 — AB (ref 35–70)
LDL Cholesterol: 67
TSH: 0.43 (ref 0.41–5.90)
Triglycerides: 122 (ref 40–160)

## 2024-01-16 LAB — TESTOSTERONE: Testosterone: 14.5

## 2024-01-16 LAB — HEMOGLOBIN A1C: Hemoglobin A1C: 5.7

## 2024-01-16 LAB — TSH: TSH: 0.43 (ref 0.41–5.90)

## 2024-01-25 ENCOUNTER — Encounter: Payer: Self-pay | Admitting: Urgent Care

## 2024-01-25 ENCOUNTER — Ambulatory Visit: Payer: 59 | Admitting: Urgent Care

## 2024-01-25 ENCOUNTER — Other Ambulatory Visit: Payer: Self-pay | Admitting: Urgent Care

## 2024-01-25 VITALS — BP 116/75 | HR 59 | Wt 242.0 lb

## 2024-01-25 DIAGNOSIS — Z6831 Body mass index (BMI) 31.0-31.9, adult: Secondary | ICD-10-CM

## 2024-01-25 DIAGNOSIS — D729 Disorder of white blood cells, unspecified: Secondary | ICD-10-CM

## 2024-01-25 DIAGNOSIS — F5101 Primary insomnia: Secondary | ICD-10-CM

## 2024-01-25 DIAGNOSIS — R109 Unspecified abdominal pain: Secondary | ICD-10-CM

## 2024-01-25 DIAGNOSIS — R7303 Prediabetes: Secondary | ICD-10-CM

## 2024-01-25 DIAGNOSIS — E782 Mixed hyperlipidemia: Secondary | ICD-10-CM

## 2024-01-25 DIAGNOSIS — I10 Essential (primary) hypertension: Secondary | ICD-10-CM

## 2024-01-25 DIAGNOSIS — R17 Unspecified jaundice: Secondary | ICD-10-CM

## 2024-01-25 DIAGNOSIS — Z1211 Encounter for screening for malignant neoplasm of colon: Secondary | ICD-10-CM

## 2024-01-25 LAB — CBC WITH DIFFERENTIAL/PLATELET
Basophils Absolute: 0.1 10*3/uL (ref 0.0–0.1)
Basophils Relative: 0.8 % (ref 0.0–3.0)
Eosinophils Absolute: 0.2 10*3/uL (ref 0.0–0.7)
Eosinophils Relative: 2.2 % (ref 0.0–5.0)
HCT: 44.7 % (ref 39.0–52.0)
Hemoglobin: 14.8 g/dL (ref 13.0–17.0)
Lymphocytes Relative: 28.2 % (ref 12.0–46.0)
Lymphs Abs: 2 10*3/uL (ref 0.7–4.0)
MCHC: 33.1 g/dL (ref 30.0–36.0)
MCV: 94.1 fl (ref 78.0–100.0)
Monocytes Absolute: 0.6 10*3/uL (ref 0.1–1.0)
Monocytes Relative: 8.8 % (ref 3.0–12.0)
Neutro Abs: 4.2 10*3/uL (ref 1.4–7.7)
Neutrophils Relative %: 60 % (ref 43.0–77.0)
Platelets: 343 10*3/uL (ref 150.0–400.0)
RBC: 4.75 Mil/uL (ref 4.22–5.81)
RDW: 12.9 % (ref 11.5–15.5)
WBC: 7 10*3/uL (ref 4.0–10.5)

## 2024-01-25 MED ORDER — TRAZODONE HCL 50 MG PO TABS: 50.0000 mg | ORAL_TABLET | Freq: Every evening | ORAL | 1 refills | Status: DC | PRN

## 2024-01-25 MED ORDER — ROSUVASTATIN CALCIUM 20 MG PO TABS: 20.0000 mg | ORAL_TABLET | Freq: Every evening | ORAL | 1 refills | Status: DC

## 2024-01-25 MED ORDER — LISINOPRIL 5 MG PO TABS: 5.0000 mg | ORAL_TABLET | Freq: Every day | ORAL | 1 refills | Status: DC

## 2024-01-25 NOTE — Progress Notes (Signed)
 New STAT CT order placed due to prior order being placed at a non-approved imaging facility per insurance. New stat order placed for [facility] imaging.

## 2024-01-25 NOTE — Progress Notes (Signed)
 Established Patient Office Visit  Subjective:  Patient ID: Jurrell Royster, male    DOB: [birth year]  Age: 53 y.o. MRN: [id number]  Chief Complaint  Patient presents with   Follow-up    6 month follow up. Pt had labs last week while in [place]. Pt would like to discuss options for sleep aids and would like a referral for a colonoscopy. He has also been having left knee pain and has been wearing a brace.    HPI   Discussed the use of AI scribe software for clinical note transcription with the patient, who gave verbal consent to proceed.  History of Present Illness   Farhan Jean is a 53 year old male who presents with sleep disturbances and elevated bilirubin levels.  He has been experiencing sleep disturbances and recently underwent a sleep study. He is awaiting a CPAP machine to assist with his sleep issues and wants something to help him sleep better.  He recently returned from [place] where blood tests indicated an elevated white blood cell count of 10.9, slightly higher than normal, though he feels well without symptoms of infection. His glucose level was 114, and his hemoglobin A1c was 5.7, slightly lower than the previous 5.9 in November. His cholesterol has improved significantly from 240 in November to 124 recently. He has been prescribed rosuvastatin  and states that he takes it approximately three times a week.  He has a history of hypertension, for which he takes lisinopril  almost daily, although he did not take it while in [place] and felt well without it.   He has a history of fatty liver, and recent blood tests showed elevated bilirubin levels at 3.7, significantly higher than the normal range. T bili was 1.3 in Nov 2024. He experiences discomfort in the upper abdomen, but primarily on the left side after eating, described as 'uncomfortable' and sometimes painful. He underwent an ultrasound in [place] but does not have the results with him.  He mentions occasional discomfort in the  rectal area, described as 'pulsating' or 'stabbing' pain, not associated with bowel movements or blood. He had a colonoscopy three years ago in [place], which revealed small polyps.  He is the owner of a restaurant, which he acknowledges may contribute to his stress levels and blood pressure. He has been monitoring his diet more closely, which he believes has contributed to his improved cholesterol levels.      Patient Active Problem List   Diagnosis Date Noted   Nasal septal deviation 08/09/2023   High total bilirubin 07/27/2023   GERD (gastroesophageal reflux disease) 06/29/2023   Migraines 06/29/2023   Colon polyps 06/29/2023   Essential hypertension 06/29/2023   Mixed hyperlipidemia 06/29/2023   Snoring 06/29/2023   Work-related stress 06/29/2023   BMI 31.0-31.9,adult 06/29/2023   Dyslipidemia 06/23/2022   Elevated blood-pressure reading without diagnosis of hypertension 06/23/2022   Hyperbilirubinemia 06/23/2022   Insomnia 06/23/2022   History reviewed. No pertinent past medical history. Past Surgical History:  Procedure Laterality Date   APPENDECTOMY  2010   HERNIA REPAIR Bilateral    inguinal   NOSE SURGERY     Social History   Socioeconomic History   Marital status: Married    Spouse name: Not on file   Number of children: Not on file   Years of education: Not on file   Highest education level: Not on file  Occupational History   Not on file  Tobacco Use   Smoking status: Former   Smokeless tobacco: Never  Vaping Use   Vaping status: Never Used  Substance and Sexual Activity   Alcohol use: Yes   Drug use: No   Sexual activity: Yes    Partners: Female  Other Topics Concern   Not on file  Social History Narrative   Not on file   Social Drivers of Health   Financial Resource Strain: Not on file  Food Insecurity: Not on file  Transportation Needs: Not on file  Physical Activity: Not on file  Stress: Not on file  Social Connections: Not on file   Intimate Partner Violence: Not on file      ROS: as noted in HPI  Objective:     BP 116/75   Pulse (!) 59   Wt 242 lb (109.8 kg)   SpO2 98%   BMI 31.93 kg/m  BP Readings from Last 3 Encounters:  01/25/24 116/75  07/27/23 130/80  06/29/23 (!) 140/90   Wt Readings from Last 3 Encounters:  01/25/24 242 lb (109.8 kg)  07/27/23 239 lb 6.4 oz (108.6 kg)  06/29/23 236 lb 6.4 oz (107.2 kg)      Physical Exam Vitals and nursing note reviewed.  Constitutional:      General: He is not in acute distress.    Appearance: Normal appearance. He is not ill-appearing, toxic-appearing or diaphoretic.  HENT:     Head: Normocephalic and atraumatic.     Right Ear: Tympanic membrane, ear canal and external ear normal. There is no impacted cerumen.     Left Ear: Tympanic membrane, ear canal and external ear normal. There is no impacted cerumen.     Nose: Nose normal.     Mouth/Throat:     Mouth: Mucous membranes are moist.     Pharynx: Oropharynx is clear. No oropharyngeal exudate or posterior oropharyngeal erythema.  Eyes:     General: No scleral icterus.       Right eye: No discharge.        Left eye: No discharge.     Extraocular Movements: Extraocular movements intact.     Pupils: Pupils are equal, round, and reactive to light.  Neck:     Thyroid: No thyroid mass, thyromegaly or thyroid tenderness.  Cardiovascular:     Rate and Rhythm: Normal rate and regular rhythm.     Pulses: Normal pulses.     Heart sounds: No murmur heard. Pulmonary:     Effort: Pulmonary effort is normal. No respiratory distress.     Breath sounds: Normal breath sounds. No stridor. No wheezing or rhonchi.  Abdominal:     General: Abdomen is flat. Bowel sounds are normal. There is no distension.     Palpations: Abdomen is soft. There is no fluid wave, hepatomegaly, splenomegaly or mass.     Tenderness: There is abdominal tenderness. There is no right CVA tenderness, left CVA tenderness, guarding or  rebound. Negative signs include Murphy's sign, Rovsing's sign and McBurney's sign.     Hernia: No hernia is present.       Comments: Negative splenic percussion sign Tympanic throughout  Musculoskeletal:     Cervical back: Normal range of motion and neck supple. No rigidity or tenderness.     Right lower leg: No edema.     Left lower leg: No edema.  Lymphadenopathy:     Cervical: No cervical adenopathy.  Skin:    General: Skin is warm and dry.     Coloration: Skin is jaundiced (slight yellowing of skin, normal sclera).  Findings: No bruising, erythema or rash.  Neurological:     General: No focal deficit present.     Mental Status: He is alert and oriented to person, place, and time.     Sensory: No sensory deficit.     Motor: No weakness.  Psychiatric:        Mood and Affect: Mood normal.        Behavior: Behavior normal.     Last CBC Lab Results  Component Value Date   WBC 8.4 06/29/2023   HGB 15.4 01/13/2024   HCT 49 01/13/2024   MCV 97.0 06/29/2023   RDW 13.5 06/29/2023   PLT 291.0 06/29/2023   Last metabolic panel Lab Results  Component Value Date   GLUCOSE 96 06/29/2023   NA 143 01/13/2024   K 5.2 (A) 01/13/2024   CL 103 01/13/2024   CO2 31 06/29/2023   BUN 17 01/13/2024   CREATININE 0.9 01/13/2024   GFR 99.62 06/29/2023   CALCIUM  9.9 01/13/2024   PROT 7.4 06/29/2023   ALBUMIN 4.3 01/13/2024   BILITOT 1.3 (H) 06/29/2023   ALKPHOS 72 06/29/2023   AST 28 01/13/2024   ALT 36 01/13/2024   Last lipids Lab Results  Component Value Date   CHOL 124 01/16/2024   HDL 33 (A) 01/16/2024   LDLCALC 67 01/16/2024   TRIG 122 01/16/2024   CHOLHDL 6 06/29/2023   Last hemoglobin A1c Lab Results  Component Value Date   HGBA1C 5.7 01/16/2024   Last thyroid functions Lab Results  Component Value Date   TSH 0.43 01/16/2024   Last vitamin D No results found for: "25OHVITD2", "25OHVITD3", "VD25OH" Last vitamin B12 and Folate No results found for:  "VITAMINB12", "FOLATE"    The ASCVD Risk score (Arnett DK, et al., 2019) failed to calculate for the following reasons:   The valid total cholesterol range is 130 to 320 mg/dL  Assessment & Plan:  Essential hypertension -     Lisinopril ; Take 1 tablet (5 mg total) by mouth daily. Take in the morning  Dispense: 90 tablet; Refill: 1  Mixed hyperlipidemia -     Rosuvastatin  Calcium ; Take 1 tablet (20 mg total) by mouth at bedtime.  Dispense: 90 tablet; Refill: 1  High total bilirubin -     Bilirubin, fractionated(tot/dir/indir) -     CT ABDOMEN PELVIS W CONTRAST; Future -     Comprehensive metabolic panel with GFR -     CBC with Differential/Platelet -     Lactate dehydrogenase -     Reticulocytes  BMI 31.0-31.9,adult  Prediabetes  Screen for colon cancer -     Ambulatory referral to Gastroenterology  Abnormal WBC count -     CT ABDOMEN PELVIS W CONTRAST; Future -     CBC with Differential/Platelet  Intermittent abdominal pain -     Ambulatory referral to Gastroenterology -     CT ABDOMEN PELVIS W CONTRAST; Future -     Comprehensive metabolic panel with GFR -     Lactate dehydrogenase -     Reticulocytes  Jaundice -     Bilirubin, fractionated(tot/dir/indir) -     CT ABDOMEN PELVIS W CONTRAST; Future -     Comprehensive metabolic panel with GFR -     CBC with Differential/Platelet -     Lactate dehydrogenase -     Reticulocytes  Primary insomnia -     traZODone HCl; Take 1 tablet (50 mg total) by mouth at bedtime as needed for sleep.  Dispense: 90 tablet; Refill: 1  Assessment and Plan    Elevated Bilirubin Bilirubin at 3.7 suggests possible gallbladder issue. Fatty liver noted, but bilirubin elevation is primary concern. Ultrasound results from [place] unavailable. - Order repeat bilirubin level and other labs to assess for cause. - Obtain ultrasound results from [place] or perform new ultrasound. - obtain stat CT abd/pelvis given patients intermittent sx and  concerns for developing jaundice.  Sleep Apnea Awaiting CPAP machine for management. - Ensure receipt of CPAP machine. - trial of trazodone nightly for insomnia  Colorectal Health Occasional rectal discomfort. Previous colonoscopy showed small polyps. Interested in repeat colonoscopy. - Schedule colonoscopy, referral placed.  Hyperlipidemia Cholesterol improved from 240 to 124 on rosuvastatin . Diet adherence noted. - Continue rosuvastatin  three times a week.  Hypertension Blood pressure well-controlled on lisinopril . - Continue lisinopril .  Pre-diabetes Glucose 114, A1c 5.7, improved from 5.9. - Monitor glucose and A1c regularly.         Return in about 6 months (around 07/26/2024).   Mandy Second, PA

## 2024-01-25 NOTE — Patient Instructions (Addendum)
 Your bilirubin level is too high. This can be a sign of gallbladder injury or disease. We drew labs today to further assess this. I would like you to obtain a STAT CT scan of the abdomen to further evaluate this.  I have refilled your medications for blood pressure and cholesterol.  I have prescribed you a medication to try to help with sleep. Take 30 min prior to sleep. I would also recommend 10mg  of melatonin at night time.   Please follow up for routine evaluation in 6 months at the following location. [facility] at [facility] [address], [place], [place] [address] Phone: [phone]

## 2024-01-26 ENCOUNTER — Ambulatory Visit: Payer: Self-pay | Admitting: Urgent Care

## 2024-01-26 LAB — COMPREHENSIVE METABOLIC PANEL WITH GFR
ALT: 16 U/L (ref 0–53)
AST: 16 U/L (ref 0–37)
Albumin: 4.3 g/dL (ref 3.5–5.2)
Alkaline Phosphatase: 60 U/L (ref 39–117)
BUN: 11 mg/dL (ref 6–23)
CO2: 25 meq/L (ref 19–32)
Calcium: 9.7 mg/dL (ref 8.4–10.5)
Chloride: 104 meq/L (ref 96–112)
Creatinine, Ser: 0.85 mg/dL (ref 0.40–1.50)
GFR: 98.87 mL/min (ref >60.00)
Glucose, Bld: 98 mg/dL (ref 70–99)
Potassium: 4.8 meq/L (ref 3.5–5.1)
Sodium: 140 meq/L (ref 135–145)
Total Bilirubin: 0.7 mg/dL (ref 0.2–1.2)
Total Protein: 6.7 g/dL (ref 6.0–8.3)

## 2024-01-26 LAB — BILIRUBIN, FRACTIONATED(TOT/DIR/INDIR)
Bilirubin, Direct: 0.1 mg/dL (ref 0.0–0.2)
Indirect Bilirubin: 0.6 mg/dL (ref 0.2–1.2)
Total Bilirubin: 0.7 mg/dL (ref 0.2–1.2)

## 2024-01-26 LAB — RETICULOCYTES
ABS Retic: 84420 {cells}/uL (ref 25000–90000)
Retic Ct Pct: 1.8 %

## 2024-01-26 LAB — LACTATE DEHYDROGENASE: LDH: 144 U/L (ref 120–250)

## 2024-01-31 ENCOUNTER — Ambulatory Visit: Payer: Self-pay

## 2024-01-31 ENCOUNTER — Ambulatory Visit
Admission: RE | Admit: 2024-01-31 | Discharge: 2024-01-31 | Disposition: A | Discharge status: Home / Self Care | Source: Ambulatory Visit | Attending: Urgent Care | Admitting: Urgent Care

## 2024-01-31 DIAGNOSIS — R17 Unspecified jaundice: Secondary | ICD-10-CM

## 2024-01-31 DIAGNOSIS — D729 Disorder of white blood cells, unspecified: Secondary | ICD-10-CM

## 2024-01-31 DIAGNOSIS — R109 Unspecified abdominal pain: Secondary | ICD-10-CM

## 2024-01-31 MED ORDER — IOPAMIDOL (ISOVUE-300) INJECTION 61%
100.0000 mL | Freq: Once | INTRAVENOUS | Status: AC | PRN
  Administered 2024-01-31: 100 mL via INTRAVENOUS

## 2024-01-31 NOTE — Telephone Encounter (Signed)
 FYI Only or Action Required?: FYI only for provider  Patient was last seen in primary care on 01/25/2024 by Corita Diego L, PA. Called Nurse Triage reporting Knee Pain. Symptoms began several weeks ago. Interventions attempted: Rest, hydration, or home remedies. Symptoms are: gradually worsening.  Triage Disposition: See PCP Within 2 Weeks  Patient/caregiver understands and will follow disposition?: Yes, will follow disposition  Copied from CRM [id number]. Topic: Clinical - Red Word Triage >> Jan 31, 2024 10:08 AM Albertha Alosa wrote: [facility] that prompted transfer to Nurse Triage: Patient stated he is in pain, his knee is bothering him , stated he feels like his bones move in his knee Reason for Disposition  [1] MILD pain (e.g., does not interfere with normal activities) AND [2] present > 7 days  Answer Assessment - Initial Assessment Questions 1. LOCATION and RADIATION: Where is the pain located?      R knee 2. QUALITY: What does the pain feel like?  (e.g., sharp, dull, aching, burning)     Bones moving inside 3. SEVERITY: How bad is the pain? What does it keep you from doing?   (Scale 1-10; or mild, moderate, severe)   -  MILD (1-3): doesn't interfere with normal activities    -  MODERATE (4-7): interferes with normal activities (e.g., work or school) or awakens from sleep, limping    -  SEVERE (8-10): excruciating pain, unable to do any normal activities, unable to walk     5 4. ONSET: When did the pain start? Does it come and go, or is it there all the time?     About 5 weeks 5. RECURRENT: Have you had this pain before? If Yes, ask: When, and what happened then?     denies 6. SETTING: Has there been any recent work, exercise or other activity that involved that part of the body?      Worse with a lot of activity 7. AGGRAVATING FACTORS: What makes the knee pain worse? (e.g., walking, climbing stairs, running)     Worse with activity, about 40 minutes of walking 8.  ASSOCIATED SYMPTOMS: Is there any swelling or redness of the knee?     Swelling,  9. OTHER SYMPTOMS: Do you have any other symptoms? (e.g., chest pain, difficulty breathing, fever, calf pain)     denies  Protocols used: Knee Pain-A-AH

## 2024-02-03 ENCOUNTER — Ambulatory Visit: Payer: Self-pay | Admitting: Urgent Care

## 2024-02-03 DIAGNOSIS — R932 Abnormal findings on diagnostic imaging of liver and biliary tract: Secondary | ICD-10-CM

## 2024-02-06 ENCOUNTER — Ambulatory Visit (INDEPENDENT_AMBULATORY_CARE_PROVIDER_SITE_OTHER)

## 2024-02-06 ENCOUNTER — Ambulatory Visit (INDEPENDENT_AMBULATORY_CARE_PROVIDER_SITE_OTHER): Admitting: Urgent Care

## 2024-02-06 ENCOUNTER — Encounter: Payer: Self-pay | Admitting: Urgent Care

## 2024-02-06 VITALS — BP 120/77 | HR 56 | Resp 18 | Ht 73.0 in | Wt 243.2 lb

## 2024-02-06 DIAGNOSIS — M25461 Effusion, right knee: Secondary | ICD-10-CM

## 2024-02-06 DIAGNOSIS — T7840XA Allergy, unspecified, initial encounter: Secondary | ICD-10-CM

## 2024-02-06 DIAGNOSIS — R932 Abnormal findings on diagnostic imaging of liver and biliary tract: Secondary | ICD-10-CM | POA: Diagnosis not present

## 2024-02-06 DIAGNOSIS — M25561 Pain in right knee: Secondary | ICD-10-CM

## 2024-02-06 MED ORDER — DICLOFENAC SODIUM 75 MG PO TBEC: 75.0000 mg | DELAYED_RELEASE_TABLET | Freq: Two times a day (BID) | ORAL | 0 refills | Status: DC

## 2024-02-06 MED ORDER — METHYLPREDNISOLONE ACETATE 80 MG/ML IJ SUSP
80.0000 mg | Freq: Once | INTRAMUSCULAR | Status: AC
  Administered 2024-02-06: 80 mg via INTRAMUSCULAR

## 2024-02-06 NOTE — Patient Instructions (Signed)
 Por favor ir el nivel 1 para obtener el rayos -x del rodilla. Tambien, yo envie una receta para tu pharmacia. Tomarlo con comida, solamente cuando estas hinchado o tienes dolor del rodilla.

## 2024-02-06 NOTE — Progress Notes (Signed)
 Established Patient Office Visit  Subjective:  Patient ID: Samuel Gomez, male    DOB: [birth year]  Age: 53 y.o. MRN: [id number]  Chief Complaint  Patient presents with   Allergic Reaction    Pt states he got bitten by something while taking out trash it itches and has radiating sharp shooting pains on left arm about 1 hr ago. Also states his right knee has been bothering him x6 weeks    HPI  Discussed the use of AI scribe software for clinical note transcription with the patient, who gave verbal consent to proceed.  History of Present Illness   Samuel Gomez is a 53 year old male who presents with a recent insect bite and ongoing knee pain.  He was bitten by an unknown insect approximately one hour prior to the visit. He describes the sensation as 'like fire inside,' with intermittent warmth at the site of the bite. He applied a small amount of ice to the area upon arriving at work. No difficulty swallowing, tongue swelling, or respiratory issues.  He has been experiencing ongoing right knee pain for approximately six weeks. The pain occurs intermittently and is described as sharp, often feeling as though 'something moves' within the knee. The pain is exacerbated by walking, especially on stairs, and is sometimes accompanied by swelling. He has been using a knee brace, which he feels has been helpful. He denies taking any medication for the knee pain.  He has a history of a fall three years ago, which was treated by a chiropractor for back issues, but he is unsure if this is related to his current knee problem.  He is scheduled for an ultrasound tomorrow to evaluate a liver lesion identified on a previous CT scan. He mentions that prior lab results from [place] showed elevated levels, but recent tests are normal.      Patient Active Problem List   Diagnosis Date Noted   Nasal septal deviation 08/09/2023   High total bilirubin 07/27/2023   GERD (gastroesophageal reflux disease) 06/29/2023    Migraines 06/29/2023   Colon polyps 06/29/2023   Essential hypertension 06/29/2023   Mixed hyperlipidemia 06/29/2023   Snoring 06/29/2023   Work-related stress 06/29/2023   BMI 31.0-31.9,adult 06/29/2023   Dyslipidemia 06/23/2022   Elevated blood-pressure reading without diagnosis of hypertension 06/23/2022   Hyperbilirubinemia 06/23/2022   Insomnia 06/23/2022   History reviewed. No pertinent past medical history. Past Surgical History:  Procedure Laterality Date   APPENDECTOMY  2010   HERNIA REPAIR Bilateral    inguinal   NOSE SURGERY     Social History   Tobacco Use   Smoking status: Former   Smokeless tobacco: Never  Vaping Use   Vaping status: Never Used  Substance Use Topics   Alcohol use: Yes   Drug use: No      ROS: as noted in HPI  Objective:     BP 120/77 (BP Location: Left Arm, Patient Position: Sitting, Cuff Size: Normal)   Pulse (!) 56   Resp 18   Ht 6' 1 (1.854 m)   Wt 243 lb 4 oz (110.3 kg)   SpO2 99%   BMI 32.09 kg/m  BP Readings from Last 3 Encounters:  02/06/24 120/77  01/25/24 116/75  07/27/23 130/80   Wt Readings from Last 3 Encounters:  02/06/24 243 lb 4 oz (110.3 kg)  01/25/24 242 lb (109.8 kg)  07/27/23 239 lb 6.4 oz (108.6 kg)      Physical Exam Vitals and nursing  note reviewed.  Constitutional:      General: He is not in acute distress.    Appearance: Normal appearance. He is not ill-appearing, toxic-appearing or diaphoretic.  HENT:     Head: Normocephalic and atraumatic.     Mouth/Throat:     Mouth: Mucous membranes are moist.     Pharynx: Oropharynx is clear. Uvula midline. No pharyngeal swelling or uvula swelling.   Cardiovascular:     Rate and Rhythm: Normal rate.   Musculoskeletal:     Right upper leg: Normal.     Left upper leg: Normal.     Right knee: Crepitus present. No swelling, erythema or bony tenderness. Normal range of motion. Tenderness present over the lateral joint line and LCL. No medial joint  line, MCL or patellar tendon tenderness. No LCL laxity, MCL laxity, ACL laxity or PCL laxity. Normal alignment, normal meniscus and normal patellar mobility. Normal pulse.     Instability Tests: Anterior drawer test negative. Posterior drawer test negative. Medial McMurray test negative.     Right lower leg: Normal. No swelling.   Skin:    General: Skin is warm and dry.     Coloration: Skin is not jaundiced.     Findings: Rash (erythema, hives, and swelling to L volar arm primarily surrounding the antecubital fossa) present. No bruising.   Neurological:     Mental Status: He is alert.      No results found for any visits on 02/06/24.  Last CBC Lab Results  Component Value Date   WBC 7.0 01/25/2024   HGB 14.8 01/25/2024   HCT 44.7 01/25/2024   MCV 94.1 01/25/2024   RDW 12.9 01/25/2024   PLT 343.0 01/25/2024   Last metabolic panel Lab Results  Component Value Date   GLUCOSE 98 01/25/2024   NA 140 01/25/2024   K 4.8 01/25/2024   CL 104 01/25/2024   CO2 25 01/25/2024   BUN 11 01/25/2024   CREATININE 0.85 01/25/2024   GFR 98.87 01/25/2024   CALCIUM  9.7 01/25/2024   PROT 6.7 01/25/2024   ALBUMIN 4.3 01/25/2024   BILITOT 0.7 01/25/2024   ALKPHOS 60 01/25/2024   AST 16 01/25/2024   ALT 16 01/25/2024      The ASCVD Risk score (Arnett DK, et al., 2019) failed to calculate for the following reasons:   The valid total cholesterol range is 130 to 320 mg/dL  Assessment & Plan:  Allergic reaction, initial encounter -     methylPREDNISolone Acetate  Acute pain of right knee -     DG Knee Complete 4 Views Right; Future -     Diclofenac Sodium; Take 1 tablet (75 mg total) by mouth 2 (two) times daily. Take with food  Dispense: 30 tablet; Refill: 0  Abnormal CT of liver  Assessment and Plan    Allergic Reaction Acute allergic reaction to unknown insect bite on arm. No respiratory distress or throat swelling. Differed from previous ant bites. Consent obtained for  dexamethasone injection. - Administer dexamethasone injection.  Knee Pain Intermittent right knee pain for six weeks with crepitus, suggesting possible joint degeneration or misalignment. X-ray needed for further assessment. - Order X-ray of the right knee. - Advise use of knee brace as needed - trial of diclofenac BID with food, PRN pain/swelling  Liver Lesion Incidental liver lesion on CT, differential includes hemangioma. Initial labs elevated, current labs normal. Ultrasound scheduled for further evaluation. - Proceed with scheduled liver ultrasound.  No follow-ups on file.   Mandy Second, PA

## 2024-02-07 ENCOUNTER — Ambulatory Visit
Admission: RE | Admit: 2024-02-07 | Discharge: 2024-02-07 | Disposition: A | Discharge status: Home / Self Care | Source: Ambulatory Visit | Attending: Urgent Care | Admitting: Urgent Care

## 2024-02-07 ENCOUNTER — Ambulatory Visit: Payer: Self-pay | Admitting: Urgent Care

## 2024-02-07 DIAGNOSIS — R932 Abnormal findings on diagnostic imaging of liver and biliary tract: Secondary | ICD-10-CM

## 2024-06-04 ENCOUNTER — Ambulatory Visit
Admission: EM | Admit: 2024-06-04 | Discharge: 2024-06-04 | Disposition: A | Discharge status: Home / Self Care | Attending: Family Medicine | Admitting: Family Medicine

## 2024-06-04 DIAGNOSIS — R059 Cough, unspecified: Secondary | ICD-10-CM | POA: Diagnosis not present

## 2024-06-04 DIAGNOSIS — J309 Allergic rhinitis, unspecified: Secondary | ICD-10-CM

## 2024-06-04 DIAGNOSIS — J069 Acute upper respiratory infection, unspecified: Secondary | ICD-10-CM

## 2024-06-04 MED ORDER — FEXOFENADINE HCL 180 MG PO TABS: 180.0000 mg | ORAL_TABLET | Freq: Every day | ORAL | 0 refills | Status: AC

## 2024-06-04 MED ORDER — PREDNISONE 20 MG PO TABS: ORAL_TABLET | ORAL | 0 refills | Status: DC

## 2024-06-04 MED ORDER — AMOXICILLIN-POT CLAVULANATE 875-125 MG PO TABS: 1.0000 | ORAL_TABLET | Freq: Two times a day (BID) | ORAL | 0 refills | Status: AC

## 2024-06-04 NOTE — ED Triage Notes (Signed)
 [facility] to [facility] with [facility]  productive cough and congestion for one week. Pt reports using nyquill with some improvement.

## 2024-06-04 NOTE — ED Provider Notes (Signed)
 [facility]    CSN: [id number] Arrival date & time: 06/04/24  0817      History   Chief Complaint Chief Complaint  Patient presents with   Cough    HPI Zyrion Coey is a 53 y.o. male.   HPI Very pleasant 53 year old male presents with productive cough for a week.  PMH significant for HTN, mixed HLD, and insomnia.  History reviewed. No pertinent past medical history.  Patient Active Problem List   Diagnosis Date Noted   Nasal septal deviation 08/09/2023   High total bilirubin 07/27/2023   GERD (gastroesophageal reflux disease) 06/29/2023   Migraines 06/29/2023   Colon polyps 06/29/2023   Essential hypertension 06/29/2023   Mixed hyperlipidemia 06/29/2023   Snoring 06/29/2023   Work-related stress 06/29/2023   BMI 31.0-31.9,adult 06/29/2023   Dyslipidemia 06/23/2022   Elevated blood-pressure reading without diagnosis of hypertension 06/23/2022   Hyperbilirubinemia 06/23/2022   Insomnia 06/23/2022    Past Surgical History:  Procedure Laterality Date   APPENDECTOMY  2010   HERNIA REPAIR Bilateral    inguinal   NOSE SURGERY         Home Medications    Prior to Admission medications   Medication Sig Start Date End Date Taking? Authorizing Provider  amoxicillin -clavulanate (AUGMENTIN ) 875-125 MG tablet Take 1 tablet by mouth every 12 (twelve) hours. 06/04/24  Yes Teddy Sharper, FNP  fexofenadine [facility] ALLERGY) 180 MG tablet Take 1 tablet (180 mg total) by mouth daily for 15 days. 06/04/24 06/19/24 Yes Teddy Sharper, FNP  predniSONE  (DELTASONE ) 20 MG tablet Take 3 tabs PO daily x 5 days. 06/04/24  Yes Teddy Sharper, FNP  diclofenac  (VOLTAREN ) 75 MG EC tablet Take 1 tablet (75 mg total) by mouth 2 (two) times daily. Take with food 02/06/24   Crain, Whitney L, PA  lisinopril  (ZESTRIL ) 5 MG tablet Take 1 tablet (5 mg total) by mouth daily. Take in the morning 01/25/24   Crain, Whitney L, PA  rosuvastatin  (CRESTOR ) 20 MG tablet Take 1 tablet (20 mg  total) by mouth at bedtime. 01/25/24   Crain, Whitney L, PA  traZODone  (DESYREL ) 50 MG tablet Take 1 tablet (50 mg total) by mouth at bedtime as needed for sleep. 01/25/24   Lowella Benton CROME, PA    Family History Family History  Problem Relation Age of Onset   Stroke Mother    Hypertension Mother    Rheum arthritis Mother    Hypertension Father    Cancer Father     Social History Social History   Tobacco Use   Smoking status: Some Days    Types: Cigarettes   Smokeless tobacco: Never  Vaping Use   Vaping status: Never Used  Substance Use Topics   Alcohol use: Yes   Drug use: No     Allergies   Patient has no known allergies.   Review of Systems Review of Systems  Respiratory:  Positive for cough.   All other systems reviewed and are negative.    Physical Exam Triage Vital Signs ED Triage Vitals  Encounter Vitals Group     BP      Girls Systolic BP Percentile      Girls Diastolic BP Percentile      Boys Systolic BP Percentile      Boys Diastolic BP Percentile      Pulse      Resp      Temp      Temp src      SpO2  Weight      Height      Head Circumference      Peak Flow      Pain Score      Pain Loc      Pain Education      Exclude from Growth Chart    No data found.  Updated Vital Signs BP 137/87   Pulse 67   Temp 98.3 F (36.8 C)   Resp 19   SpO2 98%   Visual Acuity Right Eye Distance:   Left Eye Distance:   Bilateral Distance:    Right Eye Near:   Left Eye Near:    Bilateral Near:     Physical Exam Vitals and nursing note reviewed.  Constitutional:      Appearance: Normal appearance. He is normal weight. He is ill-appearing.  HENT:     Head: Normocephalic and atraumatic.     Right Ear: Tympanic membrane, ear canal and external ear normal.     Left Ear: Tympanic membrane, ear canal and external ear normal.     Mouth/Throat:     Mouth: Mucous membranes are moist.     Pharynx: Oropharynx is clear.     Comments: Significant  amount of clear drainage of posterior oropharynx noted on exam Eyes:     Extraocular Movements: Extraocular movements intact.     Pupils: Pupils are equal, round, and reactive to light.  Cardiovascular:     Rate and Rhythm: Normal rate and regular rhythm.     Pulses: Normal pulses.     Heart sounds: Normal heart sounds.  Pulmonary:     Effort: Pulmonary effort is normal.     Breath sounds: Normal breath sounds. No wheezing, rhonchi or rales.     Comments: Infrequent nonproductive cough Musculoskeletal:        General: Normal range of motion.  Skin:    General: Skin is warm and dry.  Neurological:     General: No focal deficit present.     Mental Status: He is alert and oriented to person, place, and time. Mental status is at baseline.  Psychiatric:        Mood and Affect: Mood normal.        Behavior: Behavior normal.      UC Treatments / Results  Labs (all labs ordered are listed, but only abnormal results are displayed) Labs Reviewed - No data to display  EKG   Radiology No results found.  Procedures Procedures (including critical care time)  Medications Ordered in UC Medications - No data to display  Initial Impression / Assessment and Plan / UC Course  I have reviewed the triage vital signs and the nursing notes.  Pertinent labs & imaging results that were available during my care of the patient were reviewed by me and considered in my medical decision making (see chart for details).     MDM: 1.  Acute URI-Rx'd Augmentin  875/125 mg tablet: Take 1 tablet twice daily x 7 days; 2.  Cough, unspecified type-Rx'd prednisone  20 mg tablet: Take 3 tablets p.o. daily x 5 days; 3.  Allergic rhinitis, unspecified seasonality, unspecified trigger-Rx'd Allegra 180 mg tablet: Take 1 tablet daily x 5 days, then as needed. Advised patient to take medications as directed with food to completion.  Advised take prednisone  and Allegra with first dose of Augmentin  for the next 5 of 7  days.  Advised may use Allegra as needed afterwards for concurrent postnasal drainage/drip/runny nose/productive cough.  Encouraged increase daily  water intake to 64 ounces per day while taking these medications.  Advised if symptoms worsen and/or unresolved please follow-up with your PCP or here for further evaluation.  Patient discharged home, hemodynamically stable. Final Clinical Impressions(s) / UC Diagnoses   Final diagnoses:  Cough, unspecified type  Acute URI  Allergic rhinitis, unspecified seasonality, unspecified trigger     Discharge Instructions      Advised patient to take medications as directed with food to completion.  Advised take prednisone  and Allegra with first dose of Augmentin  for the next 5 of 7 days.  Advised may use Allegra as needed afterwards for concurrent postnasal drainage/drip/runny nose/productive cough.  Encouraged increase daily water intake to 64 ounces per day while taking these medications.  Advised if symptoms worsen and/or unresolved please follow-up with your PCP or here for further evaluation.     ED Prescriptions     Medication Sig Dispense Auth. Provider   amoxicillin -clavulanate (AUGMENTIN ) 875-125 MG tablet Take 1 tablet by mouth every 12 (twelve) hours. 14 tablet Pranay Hilbun, FNP   predniSONE  (DELTASONE ) 20 MG tablet Take 3 tabs PO daily x 5 days. 15 tablet Aadit Hagood, FNP   fexofenadine (ALLEGRA ALLERGY) 180 MG tablet Take 1 tablet (180 mg total) by mouth daily for 15 days. 15 tablet Annastyn Silvey, FNP      PDMP not reviewed this encounter.   Teddy Sharper, FNP 06/04/24 [phone]

## 2024-06-04 NOTE — Discharge Instructions (Addendum)
 Advised patient to take medications as directed with food to completion.  Advised take prednisone  and Allegra with first dose of Augmentin  for the next 5 of 7 days.  Advised may use Allegra as needed afterwards for concurrent postnasal drainage/drip/runny nose/productive cough.  Encouraged increase daily water intake to 64 ounces per day while taking these medications.  Advised if symptoms worsen and/or unresolved please follow-up with your PCP or here for further evaluation.

## 2024-06-05 ENCOUNTER — Telehealth: Payer: Self-pay

## 2024-06-05 NOTE — Telephone Encounter (Signed)
 Called to check on patient. Sts he is doing better. No needs.

## 2024-06-17 ENCOUNTER — Ambulatory Visit: Admitting: Urgent Care

## 2024-06-17 ENCOUNTER — Encounter: Payer: Self-pay | Admitting: Urgent Care

## 2024-06-17 VITALS — BP 129/76 | HR 73 | Ht 73.0 in | Wt 240.0 lb

## 2024-06-17 DIAGNOSIS — K449 Diaphragmatic hernia without obstruction or gangrene: Secondary | ICD-10-CM | POA: Diagnosis not present

## 2024-06-17 DIAGNOSIS — Z23 Encounter for immunization: Secondary | ICD-10-CM

## 2024-06-17 DIAGNOSIS — E782 Mixed hyperlipidemia: Secondary | ICD-10-CM

## 2024-06-17 DIAGNOSIS — I10 Essential (primary) hypertension: Secondary | ICD-10-CM | POA: Diagnosis not present

## 2024-06-17 DIAGNOSIS — K219 Gastro-esophageal reflux disease without esophagitis: Secondary | ICD-10-CM

## 2024-06-17 DIAGNOSIS — K222 Esophageal obstruction: Secondary | ICD-10-CM

## 2024-06-17 DIAGNOSIS — F5101 Primary insomnia: Secondary | ICD-10-CM

## 2024-06-17 MED ORDER — TRAZODONE HCL 50 MG PO TABS: 50.0000 mg | ORAL_TABLET | Freq: Every evening | ORAL | 1 refills | Status: AC | PRN

## 2024-06-17 MED ORDER — LISINOPRIL 5 MG PO TABS: 5.0000 mg | ORAL_TABLET | Freq: Every day | ORAL | 1 refills | Status: AC

## 2024-06-17 MED ORDER — ROSUVASTATIN CALCIUM 20 MG PO TABS
20.0000 mg | ORAL_TABLET | Freq: Every evening | ORAL | 1 refills | Status: AC
Start: 2024-06-17 — End: ?

## 2024-06-17 NOTE — Progress Notes (Signed)
 Established Patient Office Visit  Subjective:  Patient ID: Samuel Gomez, male    DOB: 06/27/1971  Age: 53 y.o. MRN: [id number]  Chief Complaint  Patient presents with   Results    Scan and labs    HPI  Discussed the use of AI scribe software for clinical note transcription with the patient, who gave verbal consent to proceed.  History of Present Illness   Samuel Gomez is a 53 year old male who presents for follow-up on liver imaging and gastrointestinal symptoms.  He returned from [place] approximately five to six weeks ago, where he underwent a liver checkup. Four months ago, a scan in [place] showed concerning results, prompting a repeat evaluation. Recent labs from June 5th were normal, and the patient was informed that the ultrasound showed fatty infiltration of the liver. A CT scan had previously noted a 13 mm lesion on the liver, but the patient was told it was not seen on the subsequent ultrasound.  He experiences occasional discomfort on the left side of his abdomen, particularly after consuming spicy foods, but has no right-sided abdominal pain. He describes an intermittent sensation in the rectal area, not associated with bowel movements, and feels it even when sitting or watching TV. He has not had a colonoscopy in the [place] but had one in [place] and is interested in scheduling another.  No changes in bowel movements, constipation, blood in the stool, or changes in stool shape or color. He has a history of a hiatal hernia, Schatzki's ring, grade B esophagitis, and duodenal gastric reflux, as noted in a colonoscopy from 2022. He occasionally takes Prilosec for GERD symptoms, especially after consuming spicy or greasy foods.  His current medications include lisinopril  5 mg daily for blood pressure, Crestor  for cholesterol taken intermittently, and trazodone  for sleep, which he takes variably depending on his needs. He recently completed a course of antibiotics prescribed by another  doctor, and still has a few pills remaining.  His mother resides in [place], and he takes turns with his six brothers to care for her. He travels to [place] frequently to care for his mother, who lives near [place].       Patient Active Problem List   Diagnosis Date Noted   Hiatal hernia 06/17/2024   Nasal septal deviation 08/09/2023   High total bilirubin 07/27/2023   GERD (gastroesophageal reflux disease) 06/29/2023   Migraines 06/29/2023   Colon polyps 06/29/2023   Essential hypertension 06/29/2023   Mixed hyperlipidemia 06/29/2023   Snoring 06/29/2023   Work-related stress 06/29/2023   BMI 31.0-31.9,adult 06/29/2023   Dyslipidemia 06/23/2022   Elevated blood-pressure reading without diagnosis of hypertension 06/23/2022   Hyperbilirubinemia 06/23/2022   Insomnia 06/23/2022   History reviewed. No pertinent past medical history. Past Surgical History:  Procedure Laterality Date   APPENDECTOMY  2010   HERNIA REPAIR Bilateral    inguinal   NOSE SURGERY     Social History   Tobacco Use   Smoking status: Some Days    Types: Cigarettes   Smokeless tobacco: Never  Vaping Use   Vaping status: Never Used  Substance Use Topics   Alcohol use: Yes   Drug use: No      ROS: as noted in HPI  Objective:     BP 129/76   Pulse 73   Ht 6' 1 (1.854 m)   Wt 240 lb (108.9 kg)   SpO2 97%   BMI 31.66 kg/m  BP Readings from Last 3 Encounters:  06/17/24 129/76  06/04/24 137/87  02/06/24 120/77   Wt Readings from Last 3 Encounters:  06/17/24 240 lb (108.9 kg)  02/06/24 243 lb 4 oz (110.3 kg)  01/25/24 242 lb (109.8 kg)      Physical Exam Vitals and nursing note reviewed.  Constitutional:      General: He is not in acute distress.    Appearance: Normal appearance. He is not ill-appearing, toxic-appearing or diaphoretic.  HENT:     Head: Normocephalic and atraumatic.     Right Ear: Tympanic membrane, ear canal and external ear normal. There is no impacted cerumen.      Left Ear: Tympanic membrane, ear canal and external ear normal. There is no impacted cerumen.     Nose: Nose normal.     Mouth/Throat:     Mouth: Mucous membranes are moist.     Pharynx: Oropharynx is clear. No oropharyngeal exudate or posterior oropharyngeal erythema.  Eyes:     General: No scleral icterus.       Right eye: No discharge.        Left eye: No discharge.     Extraocular Movements: Extraocular movements intact.     Pupils: Pupils are equal, round, and reactive to light.  Neck:     Thyroid: No thyroid mass, thyromegaly or thyroid tenderness.  Cardiovascular:     Rate and Rhythm: Normal rate and regular rhythm.     Pulses: Normal pulses.     Heart sounds: No murmur heard. Pulmonary:     Effort: Pulmonary effort is normal. No respiratory distress.     Breath sounds: Normal breath sounds. No stridor. No wheezing or rhonchi.  Abdominal:     General: Abdomen is flat. Bowel sounds are normal. There is no distension.     Palpations: Abdomen is soft. There is no mass.     Tenderness: There is no abdominal tenderness. There is no guarding.  Musculoskeletal:     Cervical back: Normal range of motion and neck supple. No rigidity or tenderness.     Right lower leg: No edema.     Left lower leg: No edema.  Lymphadenopathy:     Cervical: No cervical adenopathy.  Skin:    General: Skin is warm and dry.     Coloration: Skin is not jaundiced.     Findings: No bruising, erythema or rash.  Neurological:     General: No focal deficit present.     Mental Status: He is alert and oriented to person, place, and time.     Sensory: No sensory deficit.     Motor: No weakness.  Psychiatric:        Mood and Affect: Mood normal.        Behavior: Behavior normal.      No results found for any visits on 06/17/24.  Last CBC Lab Results  Component Value Date   WBC 7.0 01/25/2024   HGB 14.8 01/25/2024   HCT 44.7 01/25/2024   MCV 94.1 01/25/2024   RDW 12.9 01/25/2024   PLT 343.0  01/25/2024   Last metabolic panel Lab Results  Component Value Date   GLUCOSE 98 01/25/2024   NA 140 01/25/2024   K 4.8 01/25/2024   CL 104 01/25/2024   CO2 25 01/25/2024   BUN 11 01/25/2024   CREATININE 0.85 01/25/2024   GFR 98.87 01/25/2024   CALCIUM  9.7 01/25/2024   PROT 6.7 01/25/2024   ALBUMIN 4.3 01/25/2024   BILITOT 0.7 01/25/2024   ALKPHOS 60  01/25/2024   AST 16 01/25/2024   ALT 16 01/25/2024   Last lipids Lab Results  Component Value Date   CHOL 124 01/16/2024   HDL 33 (A) 01/16/2024   LDLCALC 67 01/16/2024   TRIG 122 01/16/2024   CHOLHDL 6 06/29/2023   Last hemoglobin A1c Lab Results  Component Value Date   HGBA1C 5.7 01/16/2024      The ASCVD Risk score (Arnett DK, et al., 2019) failed to calculate for the following reasons:   The valid total cholesterol range is 130 to 320 mg/dL  Assessment & Plan:  Hiatal hernia  Schatzki's ring  Gastroesophageal reflux disease, unspecified whether esophagitis present  Primary insomnia -     traZODone  HCl; Take 1 tablet (50 mg total) by mouth at bedtime as needed for sleep.  Dispense: 90 tablet; Refill: 1  Essential hypertension -     Lisinopril ; Take 1 tablet (5 mg total) by mouth daily. Take in the morning  Dispense: 90 tablet; Refill: 1  Mixed hyperlipidemia -     Rosuvastatin  Calcium ; Take 1 tablet (20 mg total) by mouth at bedtime.  Dispense: 90 tablet; Refill: 1  Immunization due -     Flu vaccine trivalent PF, 6mos and older(Flulaval,Afluria,Fluarix,Fluzone) -     Pneumococcal conjugate vaccine 20-valent  Assessment and Plan    Rectal/colonic discomfort Intermittent rectal discomfort persists despite recent antibiotic treatment. Previous colonoscopy in 2022 showed hiatal hernia, Schatzki's ring, grade B esophagitis, and abundant duodenal gastric reflux. - Schedule colonoscopy and consider EGD to evaluate esophagus.  Hiatal hernia, Schatzki's ring, esophagitis, duodenogastric reflux, and  gastroesophageal reflux disease Hiatal hernia, Schatzki's ring, and esophagitis with occasional dysphagia. Prilosec used as needed for GERD symptoms. - Continue Prilosec as needed for GERD symptoms. - Schedule EGD with colonoscopy to assess esophagus.  Fatty liver (hepatic steatosis) and resolved liver lesion Fatty liver noted on imaging. Previous liver lesion not visible on follow-up ultrasound. Labs normal. - Monitor for new symptoms, especially right upper quadrant pain. - Discuss MRI with gastroenterologist if symptoms or labs change.  Hypertension Controlled on lisinopril  5 mg daily.  Hyperlipidemia Managed with Crestor , taken intermittently.  Insomnia Managed with trazodone , effective at current dosing. - Refill trazodone  prescription.  Allergic rhinitis Recently treated with antibiotics, improvement noted.  General Health Maintenance Due for flu, pneumococcal, and Shingrix vaccines. - Administer flu vaccine. - Administer pneumococcal vaccine. - Discuss Shingrix vaccine at next annual visit in May.  Follow-up Next annual physical scheduled for May 2026. Labs to be repeated unless new symptoms arise. - Schedule EGD and colonoscopy. - Follow up in May 2026 for annual physical and labs.        Return in about 6 months (around 12/16/2024) for Annual Physical.   Benton LITTIE Gave, PA

## 2024-06-17 NOTE — Patient Instructions (Addendum)
 Please call [facility] to schedule your colonoscopy and EGD: [phone]  I have refilled your medications.  Please follow up in 6 months for your annual physical.  We updated your flu and pneumonia vaccine today.

## 2024-07-04 ENCOUNTER — Ambulatory Visit: Admitting: Urgent Care

## 2024-07-04 VITALS — BP 132/80 | HR 62 | Ht 73.0 in | Wt 239.0 lb

## 2024-07-04 DIAGNOSIS — K5909 Other constipation: Secondary | ICD-10-CM | POA: Diagnosis not present

## 2024-07-04 DIAGNOSIS — K648 Other hemorrhoids: Secondary | ICD-10-CM

## 2024-07-04 MED ORDER — HYDROCORTISONE ACETATE 25 MG RE SUPP: 25.0000 mg | Freq: Two times a day (BID) | RECTAL | 0 refills | Status: AC | PRN

## 2024-07-04 NOTE — Progress Notes (Signed)
 Established Patient Office Visit  Subjective:  Patient ID: Samuel Gomez, male    DOB: 1971-03-15  Age: 53 y.o. MRN: [id number]  Chief Complaint  Patient presents with   Rectal Pain    No bleeding    HPI  Discussed the use of AI scribe software for clinical note transcription with the patient, who gave verbal consent to proceed.  History of Present Illness   Samuel Gomez is a 53 year old male who presents with rectal pain.  He has been experiencing rectal pain for the past couple of months, which has progressively worsened. The pain is exacerbated by sitting and relieved after bowel movements. No bleeding or black stools are present. The discomfort is particularly noticeable when he goes to the bathroom, lays in bed, or sits on a chair, indicating discomfort when pressure is applied to the area.  He started taking Metamucil on Monday and a super colon cleanse vitamin from [facility] on Tuesday, which contains senna and psyllium. Since starting these supplements, his stool consistency has improved. He has never experienced similar symptoms or had bathroom problems before.  Additionally, he experiences intermittent right lower quadrant abdominal pain, which he rates as a 4 out of 10 on the pain scale. This pain has been occurring for a couple of weeks.      Patient Active Problem List   Diagnosis Date Noted   Hiatal hernia 06/17/2024   Nasal septal deviation 08/09/2023   High total bilirubin 07/27/2023   GERD (gastroesophageal reflux disease) 06/29/2023   Migraines 06/29/2023   Colon polyps 06/29/2023   Essential hypertension 06/29/2023   Mixed hyperlipidemia 06/29/2023   Snoring 06/29/2023   Work-related stress 06/29/2023   BMI 31.0-31.9,adult 06/29/2023   Dyslipidemia 06/23/2022   Elevated blood-pressure reading without diagnosis of hypertension 06/23/2022   Hyperbilirubinemia 06/23/2022   Insomnia 06/23/2022   History reviewed. No pertinent past medical history. Past Surgical  History:  Procedure Laterality Date   APPENDECTOMY  2010   HERNIA REPAIR Bilateral    inguinal   NOSE SURGERY     Social History   Tobacco Use   Smoking status: Some Days    Types: Cigarettes   Smokeless tobacco: Never  Vaping Use   Vaping status: Never Used  Substance Use Topics   Alcohol use: Yes   Drug use: No      ROS: as noted in HPI  Objective:     BP 132/80   Pulse 62   Ht 6' 1 (1.854 m)   Wt 239 lb (108.4 kg)   SpO2 96%   BMI 31.53 kg/m  BP Readings from Last 3 Encounters:  07/04/24 132/80  06/17/24 129/76  06/04/24 137/87   Wt Readings from Last 3 Encounters:  07/04/24 239 lb (108.4 kg)  06/17/24 240 lb (108.9 kg)  02/06/24 243 lb 4 oz (110.3 kg)      Physical Exam Vitals and nursing note reviewed. Exam conducted with a chaperone present.  Constitutional:      General: He is not in acute distress.    Appearance: Normal appearance. He is not ill-appearing, toxic-appearing or diaphoretic.  HENT:     Head: Normocephalic and atraumatic.     Right Ear: External ear normal.     Left Ear: External ear normal.     Nose: Nose normal.  Eyes:     General: No scleral icterus.    Pupils: Pupils are equal, round, and reactive to light.  Cardiovascular:     Rate and Rhythm:  Normal rate.  Pulmonary:     Effort: Pulmonary effort is normal. No respiratory distress.  Genitourinary:    Prostate: Normal.     Rectum: Guaiac result negative. External hemorrhoid present. No mass, tenderness or anal fissure. Normal anal tone.   Skin:    General: Skin is warm and dry.     Findings: No erythema or rash.  Neurological:     General: No focal deficit present.     Mental Status: He is alert and oriented to person, place, and time.      No results found for any visits on 07/04/24.  Last CBC Lab Results  Component Value Date   WBC 7.0 01/25/2024   HGB 14.8 01/25/2024   HCT 44.7 01/25/2024   MCV 94.1 01/25/2024   RDW 12.9 01/25/2024   PLT 343.0 01/25/2024    Last metabolic panel Lab Results  Component Value Date   GLUCOSE 98 01/25/2024   NA 140 01/25/2024   K 4.8 01/25/2024   CL 104 01/25/2024   CO2 25 01/25/2024   BUN 11 01/25/2024   CREATININE 0.85 01/25/2024   GFR 98.87 01/25/2024   CALCIUM  9.7 01/25/2024   PROT 6.7 01/25/2024   ALBUMIN 4.3 01/25/2024   BILITOT 0.7 01/25/2024   ALKPHOS 60 01/25/2024   AST 16 01/25/2024   ALT 16 01/25/2024   Last lipids Lab Results  Component Value Date   CHOL 124 01/16/2024   HDL 33 (A) 01/16/2024   LDLCALC 67 01/16/2024   TRIG 122 01/16/2024   CHOLHDL 6 06/29/2023   Last hemoglobin A1c Lab Results  Component Value Date   HGBA1C 5.7 01/16/2024   Last thyroid functions Lab Results  Component Value Date   TSH 0.43 01/16/2024   TSH 0.43 01/16/2024   Last vitamin D No results found for: 25OHVITD2, 25OHVITD3, VD25OH Last vitamin B12 and Folate No results found for: VITAMINB12, FOLATE    The ASCVD Risk score (Arnett DK, et al., 2019) failed to calculate for the following reasons:   The valid total cholesterol range is 130 to 320 mg/dL  Assessment & Plan:  Internal hemorrhoid -     Hydrocortisone Acetate; Place 1 suppository (25 mg total) rectally 2 (two) times daily as needed for hemorrhoids.  Dispense: 24 suppository; Refill: 0  Other constipation  Assessment and Plan    Rectal pain Chronic rectal pain improved with Metamucil and super colon cleanse vitamin. Small thrombosed hemorrhoid noted around 1 oclock - sitz bath recommended - hydrocortisone suppository - increase water and fiber intake  Right lower quadrant abdominal pain Intermittent pain, severity 4/10, awaiting gastroenterology evaluation. - Has apt Dec 4, but needs to reschedule - Await call for an earlier gastroenterology appointment; pt on cancellation list        No follow-ups on file.   Benton LITTIE Gave, PA

## 2024-07-04 NOTE — Patient Instructions (Addendum)
 Get a sitz bath and use once daily. Use the suppository twice daily as needed Follow up with GI.

## 2024-07-06 ENCOUNTER — Encounter: Payer: Self-pay | Admitting: Urgent Care

## 2024-07-24 NOTE — Progress Notes (Deleted)
 Chief Complaint: Discuss colonoscopy and rectal pain  HPI:    Mr. Samuel Gomez is a 53 year old male with a past medical history as listed below, who was referred to me by Lowella Benton CROME, PA for a complaint of rectal pain and to discuss a colonoscopy.      01/31/2024 CTAP with contrast with no acute pathology.  Colonic diverticulosis.  Bilateral inguinal hernia repair with mesh.  13 mm enhancing lesion in the posterior dome of the liver may represent a flash filling hemangioma or portal vein shunting.    02/07/2024 right upper quadrant ultrasound with CT noted mass not well-documented on ultrasound.  Recommended MRI.  Fatty infiltration of liver.    07/04/2024 seen for rectal pain.  At that time discussed is going on for a couple of months and getting worse.  Exacerbated by sitting and relieved after bowel movements, particularly noticeable when he went to the bathroom late and better sat in a chair.  He has started a colon cleanse vitamin with senna and psyllium.  Stool consistency improved.  Also right lower quadrant abdominal pain rated at 4/10.  At that time advised about sitz bath's and to follow-up with us .  External hemorrhoid on exam.  No past medical history on file.  Past Surgical History:  Procedure Laterality Date   APPENDECTOMY  2010   HERNIA REPAIR Bilateral    inguinal   NOSE SURGERY      Current Outpatient Medications  Medication Sig Dispense Refill   amoxicillin -clavulanate (AUGMENTIN ) 875-125 MG tablet Take 1 tablet by mouth every 12 (twelve) hours. (Patient not taking: Reported on 07/04/2024) 14 tablet 0   fexofenadine  (ALLEGRA  ALLERGY) 180 MG tablet Take 1 tablet (180 mg total) by mouth daily for 15 days. 15 tablet 0   hydrocortisone  (ANUSOL -HC) 25 MG suppository Place 1 suppository (25 mg total) rectally 2 (two) times daily as needed for hemorrhoids. 24 suppository 0   lisinopril  (ZESTRIL ) 5 MG tablet Take 1 tablet (5 mg total) by mouth daily. Take in the morning 90 tablet 1    rosuvastatin  (CRESTOR ) 20 MG tablet Take 1 tablet (20 mg total) by mouth at bedtime. 90 tablet 1   traZODone  (DESYREL ) 50 MG tablet Take 1 tablet (50 mg total) by mouth at bedtime as needed for sleep. 90 tablet 1   No current facility-administered medications for this visit.    Allergies as of 07/25/2024   (No Known Allergies)    Family History  Problem Relation Age of Onset   Stroke Mother    Hypertension Mother    Rheum arthritis Mother    Hypertension Father    Cancer Father     Social History   Socioeconomic History   Marital status: Married    Spouse name: Not on file   Number of children: Not on file   Years of education: Not on file   Highest education level: Not on file  Occupational History   Not on file  Tobacco Use   Smoking status: Some Days    Types: Cigarettes   Smokeless tobacco: Never  Vaping Use   Vaping status: Never Used  Substance and Sexual Activity   Alcohol use: Yes   Drug use: No   Sexual activity: Yes    Partners: Female  Other Topics Concern   Not on file  Social History Narrative   Not on file   Social Drivers of Health   Financial Resource Strain: Not on file  Food Insecurity: Not on file  Transportation Needs: Not on file  Physical Activity: Not on file  Stress: Not on file  Social Connections: Not on file  Intimate Partner Violence: Not on file    Review of Systems:    Constitutional: No weight loss, fever, chills, weakness or fatigue HEENT: Eyes: No change in vision               Ears, Nose, Throat:  No change in hearing or congestion Skin: No rash or itching Cardiovascular: No chest pain, chest pressure or palpitations   Respiratory: No SOB or cough Gastrointestinal: See HPI and otherwise negative Genitourinary: No dysuria or change in urinary frequency Neurological: No headache, dizziness or syncope Musculoskeletal: No new muscle or joint pain Hematologic: No bleeding or bruising Psychiatric: No history of  depression or anxiety    Physical Exam:  Vital signs: There were no vitals taken for this visit.  Constitutional:   Pleasant Caucasian male appears to be in NAD, Well developed, Well nourished, alert and cooperative Head:  Normocephalic and atraumatic. Eyes:   PEERL, EOMI. No icterus. Conjunctiva pink. Ears:  Normal auditory acuity. Neck:  Supple Throat: Oral cavity and pharynx without inflammation, swelling or lesion.  Respiratory: Respirations even and unlabored. Lungs clear to auscultation bilaterally.   No wheezes, crackles, or rhonchi.  Cardiovascular: Normal S1, S2. No MRG. Regular rate and rhythm. No peripheral edema, cyanosis or pallor.  Gastrointestinal:  Soft, nondistended, nontender. No rebound or guarding. Normal bowel sounds. No appreciable masses or hepatomegaly. Rectal:  Not performed.  Msk:  Symmetrical without gross deformities. Without edema, no deformity or joint abnormality.  Neurologic:  Alert and  oriented x4;  grossly normal neurologically.  Skin:   Dry and intact without significant lesions or rashes. Psychiatric: Oriented to person, place and time. Demonstrates good judgement and reason without abnormal affect or behaviors.  RELEVANT LABS AND IMAGING: CBC    Component Value Date/Time   WBC 7.0 01/25/2024 0930   RBC 4.75 01/25/2024 0930   HGB 14.8 01/25/2024 0930   HCT 44.7 01/25/2024 0930   PLT 343.0 01/25/2024 0930   MCV 94.1 01/25/2024 0930   MCHC 33.1 01/25/2024 0930   RDW 12.9 01/25/2024 0930   LYMPHSABS 2.0 01/25/2024 0930   MONOABS 0.6 01/25/2024 0930   EOSABS 0.2 01/25/2024 0930   BASOSABS 0.1 01/25/2024 0930    CMP     Component Value Date/Time   NA 140 01/25/2024 0930   NA 143 01/13/2024 0000   K 4.8 01/25/2024 0930   CL 104 01/25/2024 0930   CO2 25 01/25/2024 0930   GLUCOSE 98 01/25/2024 0930   BUN 11 01/25/2024 0930   BUN 17 01/13/2024 0000   CREATININE 0.85 01/25/2024 0930   CALCIUM  9.7 01/25/2024 0930   PROT 6.7 01/25/2024  0930   ALBUMIN 4.3 01/25/2024 0930   AST 16 01/25/2024 0930   ALT 16 01/25/2024 0930   ALKPHOS 60 01/25/2024 0930   BILITOT 0.7 01/25/2024 0930    Assessment: 1. ***  Plan: 1. ***     Delon Failing, PA-C [facility] 07/24/2024, 10:12 AM  Cc: Crain, Whitney L, PA

## 2024-07-25 ENCOUNTER — Ambulatory Visit: Admitting: Physician Assistant
